# Patient Record
Sex: Female | Born: 1970 | Race: White | Hispanic: No | Marital: Married | Smoking: Never smoker
Health system: Southern US, Community
[De-identification: ages and names within clinical notes are randomized; demographics above are authoritative.]

## PROBLEM LIST (undated history)

## (undated) DIAGNOSIS — Z8489 Family history of other specified conditions: Secondary | ICD-10-CM

## (undated) DIAGNOSIS — K5900 Constipation, unspecified: Secondary | ICD-10-CM

## (undated) DIAGNOSIS — C73 Malignant neoplasm of thyroid gland: Secondary | ICD-10-CM

## (undated) DIAGNOSIS — R112 Nausea with vomiting, unspecified: Secondary | ICD-10-CM

## (undated) DIAGNOSIS — E041 Nontoxic single thyroid nodule: Secondary | ICD-10-CM

## (undated) DIAGNOSIS — Z9889 Other specified postprocedural states: Secondary | ICD-10-CM

## (undated) DIAGNOSIS — G43909 Migraine, unspecified, not intractable, without status migrainosus: Secondary | ICD-10-CM

## (undated) HISTORY — PX: TONSILLECTOMY: SUR1361

## (undated) HISTORY — PX: ADENOIDECTOMY: SUR15

## (undated) HISTORY — DX: Constipation, unspecified: K59.00

## (undated) HISTORY — PX: VARICOSE VEIN SURGERY: SHX832

## (undated) HISTORY — PX: TYMPANOSTOMY TUBE PLACEMENT: SHX32

---

## 1998-09-22 ENCOUNTER — Inpatient Hospital Stay (HOSPITAL_COMMUNITY): Admission: AD | Admit: 1998-09-22 | Discharge: 1998-09-22 | Payer: Self-pay | Admitting: Obstetrics and Gynecology

## 1998-09-24 ENCOUNTER — Encounter: Payer: Self-pay | Admitting: Obstetrics and Gynecology

## 1998-09-24 ENCOUNTER — Ambulatory Visit (HOSPITAL_COMMUNITY): Admission: RE | Admit: 1998-09-24 | Discharge: 1998-09-24 | Payer: Self-pay | Admitting: Obstetrics and Gynecology

## 1998-10-17 ENCOUNTER — Other Ambulatory Visit: Admission: RE | Admit: 1998-10-17 | Discharge: 1998-10-17 | Payer: Self-pay | Admitting: Obstetrics and Gynecology

## 1999-05-09 ENCOUNTER — Inpatient Hospital Stay (HOSPITAL_COMMUNITY): Admission: AD | Admit: 1999-05-09 | Discharge: 1999-05-11 | Payer: Self-pay | Admitting: Obstetrics and Gynecology

## 1999-06-11 ENCOUNTER — Other Ambulatory Visit: Admission: RE | Admit: 1999-06-11 | Discharge: 1999-06-11 | Payer: Self-pay | Admitting: Obstetrics and Gynecology

## 2000-07-08 ENCOUNTER — Other Ambulatory Visit: Admission: RE | Admit: 2000-07-08 | Discharge: 2000-07-08 | Payer: Self-pay | Admitting: Obstetrics and Gynecology

## 2000-08-19 ENCOUNTER — Encounter: Payer: Self-pay | Admitting: Vascular Surgery

## 2000-08-21 ENCOUNTER — Ambulatory Visit (HOSPITAL_COMMUNITY): Admission: RE | Admit: 2000-08-21 | Discharge: 2000-08-21 | Payer: Self-pay | Admitting: Vascular Surgery

## 2001-07-23 ENCOUNTER — Other Ambulatory Visit: Admission: RE | Admit: 2001-07-23 | Discharge: 2001-07-23 | Payer: Self-pay | Admitting: Obstetrics and Gynecology

## 2002-07-25 ENCOUNTER — Other Ambulatory Visit: Admission: RE | Admit: 2002-07-25 | Discharge: 2002-07-25 | Payer: Self-pay | Admitting: Obstetrics and Gynecology

## 2003-03-24 ENCOUNTER — Ambulatory Visit (HOSPITAL_COMMUNITY): Admission: RE | Admit: 2003-03-24 | Discharge: 2003-03-24 | Payer: Self-pay | Admitting: Vascular Surgery

## 2003-09-06 ENCOUNTER — Other Ambulatory Visit: Admission: RE | Admit: 2003-09-06 | Discharge: 2003-09-06 | Payer: Self-pay | Admitting: Obstetrics and Gynecology

## 2004-10-07 ENCOUNTER — Other Ambulatory Visit: Admission: RE | Admit: 2004-10-07 | Discharge: 2004-10-07 | Payer: Self-pay | Admitting: Obstetrics and Gynecology

## 2004-10-14 ENCOUNTER — Encounter: Admission: RE | Admit: 2004-10-14 | Discharge: 2004-10-14 | Payer: Self-pay | Admitting: Obstetrics and Gynecology

## 2005-04-24 ENCOUNTER — Encounter: Admission: RE | Admit: 2005-04-24 | Discharge: 2005-04-24 | Payer: Self-pay | Admitting: Obstetrics and Gynecology

## 2013-06-11 ENCOUNTER — Other Ambulatory Visit: Payer: Self-pay | Admitting: Diagnostic Neuroimaging

## 2013-07-15 ENCOUNTER — Other Ambulatory Visit: Payer: Self-pay | Admitting: Diagnostic Neuroimaging

## 2013-09-09 ENCOUNTER — Other Ambulatory Visit: Payer: Self-pay | Admitting: Diagnostic Neuroimaging

## 2014-09-21 ENCOUNTER — Other Ambulatory Visit: Payer: Self-pay | Admitting: Obstetrics and Gynecology

## 2014-09-22 LAB — CYTOLOGY - PAP

## 2016-01-24 ENCOUNTER — Other Ambulatory Visit (HOSPITAL_COMMUNITY): Payer: Self-pay | Admitting: Surgery

## 2016-01-24 DIAGNOSIS — R221 Localized swelling, mass and lump, neck: Secondary | ICD-10-CM

## 2016-02-01 ENCOUNTER — Ambulatory Visit (HOSPITAL_COMMUNITY)
Admission: RE | Admit: 2016-02-01 | Discharge: 2016-02-01 | Disposition: A | Discharge status: Home / Self Care | Payer: 59 | Source: Ambulatory Visit | Attending: Surgery | Admitting: Surgery

## 2016-02-01 DIAGNOSIS — R221 Localized swelling, mass and lump, neck: Secondary | ICD-10-CM

## 2016-02-14 ENCOUNTER — Other Ambulatory Visit: Payer: Self-pay | Admitting: Physician Assistant

## 2016-02-14 ENCOUNTER — Other Ambulatory Visit: Payer: Self-pay | Admitting: General Surgery

## 2016-02-15 ENCOUNTER — Ambulatory Visit (HOSPITAL_COMMUNITY): Admission: RE | Admit: 2016-02-15 | Payer: 59 | Source: Ambulatory Visit

## 2016-02-15 ENCOUNTER — Encounter (HOSPITAL_COMMUNITY): Payer: Self-pay

## 2016-02-15 ENCOUNTER — Ambulatory Visit (HOSPITAL_COMMUNITY)
Admission: RE | Admit: 2016-02-15 | Discharge: 2016-02-15 | Disposition: A | Discharge status: Home / Self Care | Payer: 59 | Source: Ambulatory Visit | Attending: Surgery | Admitting: Surgery

## 2016-02-15 DIAGNOSIS — R221 Localized swelling, mass and lump, neck: Secondary | ICD-10-CM | POA: Diagnosis not present

## 2016-02-15 DIAGNOSIS — G43909 Migraine, unspecified, not intractable, without status migrainosus: Secondary | ICD-10-CM | POA: Insufficient documentation

## 2016-02-15 HISTORY — DX: Migraine, unspecified, not intractable, without status migrainosus: G43.909

## 2016-02-15 LAB — CBC
HEMATOCRIT: 43.8 % (ref 36.0–46.0)
HEMOGLOBIN: 14.5 g/dL (ref 12.0–15.0)
MCH: 30.1 pg (ref 26.0–34.0)
MCHC: 33.1 g/dL (ref 30.0–36.0)
MCV: 91.1 fL (ref 78.0–100.0)
Platelets: 228 10*3/uL (ref 150–400)
RBC: 4.81 MIL/uL (ref 3.87–5.11)
RDW: 13.4 % (ref 11.5–15.5)
WBC: 7.1 10*3/uL (ref 4.0–10.5)

## 2016-02-15 LAB — PROTIME-INR
INR: 1.02
PROTHROMBIN TIME: 13.5 s (ref 11.4–15.2)

## 2016-02-15 LAB — APTT: aPTT: 28 seconds (ref 24–36)

## 2016-02-15 MED ORDER — SODIUM CHLORIDE 0.9 % IV SOLN: INTRAVENOUS | Status: DC

## 2016-02-15 MED ORDER — LIDOCAINE HCL (PF) 1 % IJ SOLN
INTRAMUSCULAR | Status: AC
  Filled 2016-02-15: qty 10

## 2016-02-15 NOTE — H&P (Signed)
Chief Complaint: left neck mass  Referring Physician:Dr. Armandina Gemma  Supervising Physician: Arne Cleveland  Patient Status: [facility] - Out-pt  HPI: Annette Roberson is an 45 y.o. female with no other medical history who was found to have a left neck mass by her dentist.  She was sent to her PCP who ordered a CT of her neck.  This revealed a mass and she was sent to see Dr. Harlow Asa.  A neck US was then completed which revealed a large left neck mass that was partially cystic and partially solid.  A biopsy was then requested to determine what the pathology of this mass.  She is not having any symptoms from this mass.  She otherwise has no complaints.  Past Medical History:  Past Medical History:  Diagnosis Date  . Migraines     Past Surgical History:  Past Surgical History:  Procedure Laterality Date  . VARICOSE VEIN SURGERY      Family History: History reviewed. No pertinent family history.  Social History:  reports that she has never smoked. She has never used smokeless tobacco. She reports that she does not drink alcohol or use drugs.  Allergies: No Known Allergies  Medications: Medications reviewed in Epic  Please HPI for pertinent positives, otherwise complete 10 system ROS negative.  Mallampati Score: MD Evaluation Airway: WNL Heart: WNL Abdomen: WNL ASA  Classification: 1 Mallampati/Airway Score: Two  Physical Exam: BP 106/76   Pulse 86   Temp 98.3 F (36.8 C) (Oral)   Resp 16   Ht 5\' 6"  (1.676 m)   Wt 140 lb (63.5 kg)   LMP  (LMP Unknown) Comment: birth control pills(continues cycle)  SpO2 100%   BMI 22.60 kg/m  Body mass index is 22.6 kg/m. General: pleasant, WD, WN white female who is laying in bed in NAD HEENT: head is normocephalic, atraumatic.  Sclera are noninjected.  PERRL.  Ears and nose without any masses or lesions.  Mouth is pink and moist. Left neck bulge is present Heart: regular, rate, and rhythm.  Normal s1,s2. No obvious murmurs,  gallops, or rubs noted.  Palpable radial and pedal pulses bilaterally Lungs: CTAB, no wheezes, rhonchi, or rales noted.  Respiratory effort nonlabored Abd: soft, NT, ND, +BS, no masses, hernias, or organomegaly Psych: A&Ox3 with an appropriate affect.   Labs: Results for orders placed or performed during the hospital encounter of 02/15/16 (from the past 48 hour(s))  APTT upon arrival     Status: None   Collection Time: 02/15/16  6:08 AM  Result Value Ref Range   aPTT 28 24 - 36 seconds  CBC upon arrival     Status: None   Collection Time: 02/15/16  6:08 AM  Result Value Ref Range   WBC 7.1 4.0 - 10.5 K/uL   RBC 4.81 3.87 - 5.11 MIL/uL   Hemoglobin 14.5 12.0 - 15.0 g/dL   HCT 43.8 36.0 - 46.0 %   MCV 91.1 78.0 - 100.0 fL   MCH 30.1 26.0 - 34.0 pg   MCHC 33.1 30.0 - 36.0 g/dL   RDW 13.4 11.5 - 15.5 %   Platelets 228 150 - 400 K/uL  Protime-INR upon arrival     Status: None   Collection Time: 02/15/16  6:08 AM  Result Value Ref Range   Prothrombin Time 13.5 11.4 - 15.2 seconds   INR 1.02     Imaging: No results found.  Assessment/Plan 1. Left neck mass -we will plan to proceed  with a biopsy today. -patient requests no sedation -labs and vitals have been reviewed -Risks and Benefits discussed with the patient including, but not limited to bleeding, infection, damage to adjacent structures or low yield requiring additional tests. All of the patient's questions were answered, patient is agreeable to proceed. Consent signed and in chart.   Thank you for this interesting consult.  I greatly enjoyed meeting Annette Roberson and look forward to participating in their care.  A copy of this report was sent to the requesting provider on this date.  Electronically Signed: Henreitta Cea 02/15/2016, 7:51 AM   I spent a total of  30 Minutes   in face to face in clinical consultation, greater than 50% of which was counseling/coordinating care for left neck mass

## 2016-02-15 NOTE — Procedures (Signed)
[place] core biopsy L neck mass 18g cores and aspirate to surg path No complication No blood loss. See complete dictation in [facility].

## 2016-02-15 NOTE — Progress Notes (Signed)
Biopsy completed. Tolerated well. Discharge instruction given. D/C home walking with husband. Awake and alert. In no distress.

## 2016-02-28 ENCOUNTER — Other Ambulatory Visit (HOSPITAL_COMMUNITY): Payer: Self-pay | Admitting: Surgery

## 2016-02-28 ENCOUNTER — Ambulatory Visit
Admission: RE | Admit: 2016-02-28 | Discharge: 2016-02-28 | Disposition: A | Discharge status: Home / Self Care | Payer: 59 | Source: Ambulatory Visit | Attending: Surgery | Admitting: Surgery

## 2016-02-28 DIAGNOSIS — C801 Malignant (primary) neoplasm, unspecified: Secondary | ICD-10-CM

## 2016-03-03 ENCOUNTER — Other Ambulatory Visit (HOSPITAL_COMMUNITY): Payer: Self-pay | Admitting: Surgery

## 2016-03-03 DIAGNOSIS — R221 Localized swelling, mass and lump, neck: Secondary | ICD-10-CM

## 2016-03-05 ENCOUNTER — Other Ambulatory Visit: Payer: Self-pay | Admitting: Radiology

## 2016-03-06 ENCOUNTER — Other Ambulatory Visit (HOSPITAL_COMMUNITY): Payer: Self-pay | Admitting: Surgery

## 2016-03-06 ENCOUNTER — Ambulatory Visit (HOSPITAL_COMMUNITY)
Admission: RE | Admit: 2016-03-06 | Discharge: 2016-03-06 | Disposition: A | Discharge status: Home / Self Care | Payer: 59 | Source: Ambulatory Visit | Attending: Surgery | Admitting: Surgery

## 2016-03-06 DIAGNOSIS — C73 Malignant neoplasm of thyroid gland: Secondary | ICD-10-CM | POA: Diagnosis not present

## 2016-03-06 DIAGNOSIS — R221 Localized swelling, mass and lump, neck: Secondary | ICD-10-CM

## 2016-03-06 MED ORDER — LIDOCAINE-EPINEPHRINE 1 %-1:100000 IJ SOLN
INTRAMUSCULAR | Status: AC
  Filled 2016-03-06: qty 1

## 2016-03-06 NOTE — Procedures (Signed)
Technically successful US guided fine needle aspiration of indeterminate nodule within the inferior medial aspect of the left lobe of the thyroid.  Technically successful US guided aspiration of approximately 10 cc of dark black fluid from the cystic component of the complex neck mass within the inferior aspect of the left side of the neck, followed by FNA and core needle biopsy of solid component of the complex neck mass.  EBL: None    No immediate complications.   Ronny Bacon, MD Pager #: [phone]

## 2016-03-27 ENCOUNTER — Ambulatory Visit: Payer: Self-pay | Admitting: Otolaryngology

## 2016-03-27 NOTE — H&P (Signed)
Annette Roberson is a 45 y.o. female who presents as a consult Patient.   Referring Provider: Dossie Arbour*  Chief complaint: Thyroid cancer.  HPI: She was found on routine dental exam a few months ago to have a small lump in her left lower neck. 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 She had no symptoms related to this. She is otherwise in good health.  PMH/Meds/All/SocHx/FamHx/ROS:   Past Medical History:  Diagnosis Date  . Hearing loss  . Migraines   Past Surgical History:  Procedure Laterality Date  . VARICOSE VEIN SURGERY   No family history of bleeding disorders, wound healing problems or difficulty with anesthesia.   Social History   Social History  . Marital status: Married  Spouse name: N/A  . Number of children: N/A  . Years of education: N/A   Occupational History  . Not on file.   Social History Main Topics  . Smoking status: Never Smoker  . Smokeless tobacco: Never Used  . Alcohol use No  . Drug use: Unknown  . Sexual activity: Not on file   Other Topics Concern  . Not on file   Social History Narrative  . No narrative on file   Current Outpatient Prescriptions:  . amitriptyline (ELAVIL) 50 MG tablet, , Disp: , Rfl:  . cholecalciferol, vitamin D3, 1,000 unit capsule, Take 1,000 Units by mouth., Disp: , Rfl:  . eletriptan (RELPAX) 40 MG tablet, , Disp: , Rfl:  . multivitamin,tx-minerals (VITAMINS AND MINERALS) Tab, Take 1 tablet by mouth., Disp: , Rfl:  . norethindrone-ethinyl estradiol (MICROGESTIN 1/20) 1-20 mg-mcg per tablet, , Disp: , Rfl:  . POLYETHYLENE GLYCOL 3350 (MIRALAX ORAL), Take by mouth., Disp: , Rfl:  . PSYLLIUM HUSK (METAMUCIL ORAL), Take by mouth., Disp: , Rfl:  . topiramate (TOPAMAX) 100 MG tablet, , Disp: , Rfl:   A complete ROS was performed with pertinent positives/negatives noted in the HPI. The remainder of the ROS are  negative.   Physical Exam:   BP 94/59  Ht 1.676 m (5\' 6" )  Wt 63 kg (138 lb 12.8 oz)  BMI 22.40 kg/m   General: Healthy and alert, in no distress, breathing easily. Normal affect. In a pleasant mood. Head: Normocephalic, atraumatic. No masses, or scars. Eyes: Pupils are equal, and reactive to light. Vision is grossly intact. No spontaneous or gaze nystagmus. Ears: Bilateral cerumen impaction cleaned out under the microscope. Tympanic membranes are intact, with normal landmarks and the middle ears are clear and healthy. Hearing: Grossly normal. Nose: Nasal cavities are clear with healthy mucosa, no polyps or exudate.Airways are patent. Face: No masses or scars, facial nerve function is symmetric. Oral Cavity: No mucosal abnormalities are noted. Tongue with normal mobility. Dentition appears healthy. Oropharynx: Tonsils are symmetric. There are no mucosal masses identified. Tongue base appears normal and healthy. Larynx/Hypopharynx: indirect exam reveals healthy, mobile vocal cords, without mucosal lesions in the hypopharynx or larynx. Chest: Deferred Neck: Small left thyroid nodule palpable. Left supraclavicular lymph node palpable, approximately 3 cm. No other masses palpable. Neuro: Cranial nerves II-XII will normal function. Balance: Normal gate. Other findings: none.  Independent Review of Additional Tests or Records:  none  Procedures:  Procedure note:  Indications: Cerumen impaction  Details of cerumen removal were discussed with the patient and all questions were answered.  Procedure:  Using the operating microscope, both sides were cleaned of cerumen using curettes. There was  no signs of infection. Symptoms were releaved.  She tolerated this procedure well. There were no complications.  Impression & Plans:  Metastatic thyroid carcinoma. Recommend total thyroidectomy and selective left neck dissection. Risks and benefits were discussed in detail. Risk of infection,  bleeding, chyle leak, recurrent nerve injury, airway problems, tracheostomy, hypocalcemia, wound infection, were all discussed. All questions were answered. We will schedule at her convenience.

## 2016-04-03 NOTE — Pre-Procedure Instructions (Signed)
Annette Roberson  04/03/2016      [facility] - [place], [place] - [address] [place] [address] Phone: [phone] Fax: [phone]  [facility] [place], [place] - [address] [place] [address] Phone: [phone] Fax: [phone]    Your procedure is scheduled on April 10, 2016.  Report to [facility] Admitting at 0930 A.M.  Call this number if you have problems the morning of surgery:  [phone]   Remember:  Do not eat food or drink liquids after midnight.  Take these medicines the morning of surgery with A SIP OF WATER Microgestin/Junel/Loestrin, Relpax, Topamax  7 days prior to surgery STOP taking any Aspirin, Aleve, Naproxen, Ibuprofen, Motrin, Advil, Goody's, BC's, all herbal medications, fish oil,  and all vitamins    Do not wear jewelry, make-up or nail polish.  Do not wear lotions, powders, or perfumes, or deodorant.  Do not shave 48 hours prior to surgery.   Do not bring valuables to the hospital.  [facility] is not responsible for any belongings or valuables.  Contacts, dentures or bridgework may not be worn into surgery.  Leave your suitcase in the car.  After surgery it may be brought to your room.  For patients admitted to the hospital, discharge time will be determined by your treatment team.  Patients discharged the day of surgery will not be allowed to drive home.   Name and phone number of your driver:    Special instructions:   [facility]- Preparing For Surgery  Before surgery, you can play an important role. Because skin is not sterile, your skin needs to be as free of germs as possible. You can reduce the number of germs on your skin by washing with CHG (chlorahexidine gluconate) Soap before surgery.  CHG is an antiseptic cleaner which kills germs and bonds with the skin to continue killing germs even after  washing.  Please do not use if you have an allergy to CHG or antibacterial soaps. If your skin becomes reddened/irritated stop using the CHG.  Do not shave (including legs and underarms) for at least 48 hours prior to first CHG shower. It is OK to shave your face.  Please follow these instructions carefully.   1. Shower the NIGHT BEFORE SURGERY and the MORNING OF SURGERY with CHG.   2. If you chose to wash your hair, wash your hair first as usual with your normal shampoo.  3. After you shampoo, rinse your hair and body thoroughly to remove the shampoo.  4. Use CHG as you would any other liquid soap. You can apply CHG directly to the skin and wash gently with a scrungie or a clean washcloth.   5. Apply the CHG Soap to your body ONLY FROM THE NECK DOWN.  Do not use on open wounds or open sores. Avoid contact with your eyes, ears, mouth and genitals (private parts). Wash genitals (private parts) with your normal soap.  6. Wash thoroughly, paying special attention to the area where your surgery will be performed.  7. Thoroughly rinse your body with warm water from the neck down.  8. DO NOT shower/wash with your normal soap after using and rinsing off the CHG Soap.  9. Pat yourself dry with a CLEAN TOWEL.   10. Wear CLEAN PAJAMAS   11. Place CLEAN SHEETS on your  bed the night of your first shower and DO NOT SLEEP WITH PETS.    Day of Surgery: Do not apply any deodorants/lotions. Please wear clean clothes to the hospital/surgery center.      Please read over the following fact sheets that you were given. Pain Booklet

## 2016-04-04 ENCOUNTER — Ambulatory Visit (HOSPITAL_COMMUNITY)
Admission: RE | Admit: 2016-04-04 | Discharge: 2016-04-04 | Disposition: A | Discharge status: Home / Self Care | Payer: 59 | Source: Ambulatory Visit | Attending: Anesthesiology | Admitting: Anesthesiology

## 2016-04-04 ENCOUNTER — Encounter (HOSPITAL_COMMUNITY): Payer: Self-pay

## 2016-04-04 ENCOUNTER — Encounter (HOSPITAL_COMMUNITY)
Admission: RE | Admit: 2016-04-04 | Discharge: 2016-04-04 | Disposition: A | Discharge status: Home / Self Care | Payer: 59 | Source: Ambulatory Visit | Attending: Otolaryngology | Admitting: Otolaryngology

## 2016-04-04 DIAGNOSIS — Z01812 Encounter for preprocedural laboratory examination: Secondary | ICD-10-CM | POA: Insufficient documentation

## 2016-04-04 DIAGNOSIS — G43909 Migraine, unspecified, not intractable, without status migrainosus: Secondary | ICD-10-CM | POA: Insufficient documentation

## 2016-04-04 DIAGNOSIS — R221 Localized swelling, mass and lump, neck: Secondary | ICD-10-CM | POA: Diagnosis not present

## 2016-04-04 DIAGNOSIS — Z01818 Encounter for other preprocedural examination: Secondary | ICD-10-CM | POA: Insufficient documentation

## 2016-04-04 HISTORY — DX: Other specified postprocedural states: Z98.890

## 2016-04-04 HISTORY — DX: Nausea with vomiting, unspecified: R11.2

## 2016-04-04 LAB — SURGICAL PCR SCREEN
MRSA, PCR: NEGATIVE
Staphylococcus aureus: NEGATIVE

## 2016-04-04 LAB — CBC
HCT: 42 % (ref 36.0–46.0)
Hemoglobin: 14.1 g/dL (ref 12.0–15.0)
MCH: 30.9 pg (ref 26.0–34.0)
MCHC: 33.6 g/dL (ref 30.0–36.0)
MCV: 92.1 fL (ref 78.0–100.0)
PLATELETS: 226 10*3/uL (ref 150–400)
RBC: 4.56 MIL/uL (ref 3.87–5.11)
RDW: 13.4 % (ref 11.5–15.5)
WBC: 8.7 10*3/uL (ref 4.0–10.5)

## 2016-04-04 LAB — BASIC METABOLIC PANEL
Anion gap: 7 (ref 5–15)
BUN: 16 mg/dL (ref 6–20)
CALCIUM: 9.2 mg/dL (ref 8.9–10.3)
CO2: 22 mmol/L (ref 22–32)
CREATININE: 0.82 mg/dL (ref 0.44–1.00)
Chloride: 110 mmol/L (ref 101–111)
GFR calc non Af Amer: >60 mL/min (ref >60)
Glucose, Bld: 92 mg/dL (ref 65–99)
Potassium: 3.9 mmol/L (ref 3.5–5.1)
SODIUM: 139 mmol/L (ref 135–145)

## 2016-04-04 LAB — HCG, SERUM, QUALITATIVE: PREG SERUM: NEGATIVE

## 2016-04-04 NOTE — Progress Notes (Signed)
PCP Dr. Barbee Shropshire Patient denies any chest pain, shortness of breath will in PAT. Has never had a cardiologist, echo, stress test, or cardiac cath.

## 2016-04-10 ENCOUNTER — Inpatient Hospital Stay (HOSPITAL_COMMUNITY)
Admission: RE | Admit: 2016-04-10 | Discharge: 2016-04-13 | DRG: 627 | Disposition: A | Discharge status: Home / Self Care | Payer: 59 | Source: Ambulatory Visit | Attending: Otolaryngology | Admitting: Otolaryngology

## 2016-04-10 ENCOUNTER — Encounter (HOSPITAL_COMMUNITY): Payer: Self-pay | Admitting: Certified Registered Nurse Anesthetist

## 2016-04-10 ENCOUNTER — Encounter (HOSPITAL_COMMUNITY)
Admission: RE | Disposition: A | Discharge status: Home / Self Care | Payer: Self-pay | Source: Ambulatory Visit | Attending: Otolaryngology

## 2016-04-10 ENCOUNTER — Inpatient Hospital Stay (HOSPITAL_COMMUNITY): Payer: 59 | Admitting: Certified Registered Nurse Anesthetist

## 2016-04-10 DIAGNOSIS — R202 Paresthesia of skin: Secondary | ICD-10-CM | POA: Diagnosis not present

## 2016-04-10 DIAGNOSIS — R252 Cramp and spasm: Secondary | ICD-10-CM | POA: Diagnosis not present

## 2016-04-10 DIAGNOSIS — C73 Malignant neoplasm of thyroid gland: Principal | ICD-10-CM | POA: Diagnosis present

## 2016-04-10 HISTORY — PX: RADICAL NECK DISSECTION: SHX2284

## 2016-04-10 HISTORY — DX: Family history of other specified conditions: Z84.89

## 2016-04-10 HISTORY — PX: THYROIDECTOMY: SHX17

## 2016-04-10 HISTORY — DX: Malignant neoplasm of thyroid gland: C73

## 2016-04-10 HISTORY — DX: Nontoxic single thyroid nodule: E04.1

## 2016-04-10 LAB — CALCIUM: Calcium: 8.2 mg/dL — ABNORMAL LOW (ref 8.9–10.3)

## 2016-04-10 SURGERY — THYROIDECTOMY
Anesthesia: General | Site: Neck

## 2016-04-10 MED ORDER — NAPROXEN SODIUM 550 MG PO TABS
550.0000 mg | ORAL_TABLET | Freq: Every day | ORAL | Status: DC | PRN
  Filled 2016-04-10: qty 1

## 2016-04-10 MED ORDER — BACITRACIN ZINC 500 UNIT/GM EX OINT
TOPICAL_OINTMENT | CUTANEOUS | Status: AC
  Filled 2016-04-10: qty 28.35

## 2016-04-10 MED ORDER — PROPOFOL 500 MG/50ML IV EMUL
INTRAVENOUS | Status: DC | PRN
  Administered 2016-04-10: 25 ug/kg/min via INTRAVENOUS

## 2016-04-10 MED ORDER — MEPERIDINE HCL 25 MG/ML IJ SOLN: 6.2500 mg | INTRAMUSCULAR | Status: DC | PRN

## 2016-04-10 MED ORDER — 0.9 % SODIUM CHLORIDE (POUR BTL) OPTIME
TOPICAL | Status: DC | PRN
  Administered 2016-04-10: 1000 mL

## 2016-04-10 MED ORDER — HYDROCODONE-ACETAMINOPHEN 7.5-325 MG PO TABS: 1.0000 | ORAL_TABLET | Freq: Four times a day (QID) | ORAL | 0 refills | Status: DC | PRN

## 2016-04-10 MED ORDER — NORETHINDRONE ACET-ETHINYL EST 1-20 MG-MCG PO TABS
1.0000 | ORAL_TABLET | Freq: Every day | ORAL | Status: DC
  Administered 2016-04-11: 1 via ORAL

## 2016-04-10 MED ORDER — VITAMIN D3 25 MCG (1000 UNIT) PO TABS
1000.0000 [IU] | ORAL_TABLET | Freq: Every day | ORAL | Status: DC
  Administered 2016-04-10 – 2016-04-12 (×3): 1000 [IU] via ORAL
  Filled 2016-04-10 (×6): qty 1

## 2016-04-10 MED ORDER — SCOPOLAMINE 1 MG/3DAYS TD PT72
MEDICATED_PATCH | TRANSDERMAL | Status: AC
  Filled 2016-04-10: qty 1

## 2016-04-10 MED ORDER — AMITRIPTYLINE HCL 50 MG PO TABS
50.0000 mg | ORAL_TABLET | Freq: Every day | ORAL | Status: DC
  Administered 2016-04-10 – 2016-04-12 (×3): 50 mg via ORAL
  Filled 2016-04-10 (×3): qty 1

## 2016-04-10 MED ORDER — HYDROCODONE-ACETAMINOPHEN 5-325 MG PO TABS
1.0000 | ORAL_TABLET | ORAL | Status: DC | PRN
  Administered 2016-04-13: 2 via ORAL
  Filled 2016-04-10: qty 2

## 2016-04-10 MED ORDER — DEXAMETHASONE SODIUM PHOSPHATE 10 MG/ML IJ SOLN
INTRAMUSCULAR | Status: AC
  Filled 2016-04-10: qty 1

## 2016-04-10 MED ORDER — PHENYLEPHRINE HCL 10 MG/ML IJ SOLN
INTRAVENOUS | Status: DC | PRN
  Administered 2016-04-10: 25 ug/min via INTRAVENOUS

## 2016-04-10 MED ORDER — MIDAZOLAM HCL 2 MG/2ML IJ SOLN
INTRAMUSCULAR | Status: DC | PRN
  Administered 2016-04-10 (×2): 1 mg via INTRAVENOUS

## 2016-04-10 MED ORDER — LACTATED RINGERS IV SOLN
INTRAVENOUS | Status: DC
  Administered 2016-04-10 (×3): via INTRAVENOUS

## 2016-04-10 MED ORDER — ADULT MULTIVITAMIN W/MINERALS CH
1.0000 | ORAL_TABLET | Freq: Every day | ORAL | Status: DC
  Administered 2016-04-10 – 2016-04-12 (×3): 1 via ORAL
  Filled 2016-04-10 (×3): qty 1

## 2016-04-10 MED ORDER — ONDANSETRON HCL 4 MG/2ML IJ SOLN
INTRAMUSCULAR | Status: AC
  Filled 2016-04-10: qty 2

## 2016-04-10 MED ORDER — FENTANYL CITRATE (PF) 100 MCG/2ML IJ SOLN
INTRAMUSCULAR | Status: DC | PRN
  Administered 2016-04-10 (×2): 50 ug via INTRAVENOUS
  Administered 2016-04-10: 150 ug via INTRAVENOUS
  Administered 2016-04-10: 50 ug via INTRAVENOUS

## 2016-04-10 MED ORDER — ROCURONIUM BROMIDE 100 MG/10ML IV SOLN
INTRAVENOUS | Status: DC | PRN
  Administered 2016-04-10: 25 mg via INTRAVENOUS
  Administered 2016-04-10: 50 mg via INTRAVENOUS

## 2016-04-10 MED ORDER — ARTIFICIAL TEARS OP OINT
TOPICAL_OINTMENT | OPHTHALMIC | Status: AC
  Filled 2016-04-10: qty 3.5

## 2016-04-10 MED ORDER — PROPOFOL 10 MG/ML IV BOLUS
INTRAVENOUS | Status: DC | PRN
  Administered 2016-04-10: 200 mg via INTRAVENOUS

## 2016-04-10 MED ORDER — LIDOCAINE HCL (CARDIAC) 20 MG/ML IV SOLN
INTRAVENOUS | Status: DC | PRN
  Administered 2016-04-10: 30 mg via INTRATRACHEAL

## 2016-04-10 MED ORDER — PROMETHAZINE HCL 25 MG/ML IJ SOLN
INTRAMUSCULAR | Status: AC
  Filled 2016-04-10: qty 1

## 2016-04-10 MED ORDER — PROPOFOL 1000 MG/100ML IV EMUL
INTRAVENOUS | Status: AC
  Filled 2016-04-10: qty 100

## 2016-04-10 MED ORDER — SUGAMMADEX SODIUM 200 MG/2ML IV SOLN
INTRAVENOUS | Status: DC | PRN
  Administered 2016-04-10: 200 mg via INTRAVENOUS

## 2016-04-10 MED ORDER — LEVOTHYROXINE SODIUM 100 MCG PO TABS
100.0000 ug | ORAL_TABLET | Freq: Every day | ORAL | Status: DC
  Administered 2016-04-11 – 2016-04-13 (×3): 100 ug via ORAL
  Filled 2016-04-10 (×3): qty 1

## 2016-04-10 MED ORDER — PROMETHAZINE HCL 25 MG RE SUPP: 25.0000 mg | Freq: Four times a day (QID) | RECTAL | 1 refills | Status: DC | PRN

## 2016-04-10 MED ORDER — PROMETHAZINE HCL 25 MG/ML IJ SOLN
6.2500 mg | INTRAMUSCULAR | Status: DC | PRN
  Administered 2016-04-10: 12.5 mg via INTRAVENOUS

## 2016-04-10 MED ORDER — ONDANSETRON HCL 4 MG/2ML IJ SOLN
INTRAMUSCULAR | Status: DC | PRN
  Administered 2016-04-10 (×2): 4 mg via INTRAVENOUS

## 2016-04-10 MED ORDER — HYDROMORPHONE HCL 1 MG/ML IJ SOLN: 0.2500 mg | INTRAMUSCULAR | Status: DC | PRN

## 2016-04-10 MED ORDER — PROMETHAZINE HCL 25 MG RE SUPP: 25.0000 mg | Freq: Four times a day (QID) | RECTAL | Status: DC | PRN

## 2016-04-10 MED ORDER — LIDOCAINE 2% (20 MG/ML) 5 ML SYRINGE
INTRAMUSCULAR | Status: AC
  Filled 2016-04-10: qty 5

## 2016-04-10 MED ORDER — ELETRIPTAN HYDROBROMIDE 20 MG PO TABS
20.0000 mg | ORAL_TABLET | ORAL | Status: DC | PRN
  Administered 2016-04-10 (×2): 20 mg via ORAL
  Filled 2016-04-10 (×5): qty 1

## 2016-04-10 MED ORDER — POLYETHYLENE GLYCOL 3350 17 G PO PACK: 17.0000 g | PACK | Freq: Every day | ORAL | Status: DC | PRN

## 2016-04-10 MED ORDER — METOPROLOL TARTRATE 5 MG/5ML IV SOLN
INTRAVENOUS | Status: AC
  Filled 2016-04-10: qty 5

## 2016-04-10 MED ORDER — PROPOFOL 10 MG/ML IV BOLUS
INTRAVENOUS | Status: AC
  Filled 2016-04-10: qty 20

## 2016-04-10 MED ORDER — LEVOTHYROXINE SODIUM 100 MCG PO TABS: 100.0000 ug | ORAL_TABLET | Freq: Every day | ORAL | 6 refills | Status: AC

## 2016-04-10 MED ORDER — MIDAZOLAM HCL 2 MG/2ML IJ SOLN: 0.5000 mg | Freq: Once | INTRAMUSCULAR | Status: DC | PRN

## 2016-04-10 MED ORDER — GLYCOPYRROLATE 0.2 MG/ML IJ SOLN
INTRAMUSCULAR | Status: DC | PRN
  Administered 2016-04-10: 0.2 mg via INTRAVENOUS

## 2016-04-10 MED ORDER — PROMETHAZINE HCL 25 MG PO TABS: 25.0000 mg | ORAL_TABLET | Freq: Four times a day (QID) | ORAL | Status: DC | PRN

## 2016-04-10 MED ORDER — DEXAMETHASONE SODIUM PHOSPHATE 10 MG/ML IJ SOLN
INTRAMUSCULAR | Status: DC | PRN
  Administered 2016-04-10: 10 mg via INTRAVENOUS

## 2016-04-10 MED ORDER — ROCURONIUM BROMIDE 50 MG/5ML IV SOSY
PREFILLED_SYRINGE | INTRAVENOUS | Status: AC
  Filled 2016-04-10: qty 5

## 2016-04-10 MED ORDER — TOPIRAMATE 100 MG PO TABS
100.0000 mg | ORAL_TABLET | Freq: Every day | ORAL | Status: DC
  Administered 2016-04-10 – 2016-04-12 (×3): 100 mg via ORAL
  Filled 2016-04-10 (×4): qty 1

## 2016-04-10 MED ORDER — LIDOCAINE-EPINEPHRINE (PF) 1 %-1:200000 IJ SOLN
INTRAMUSCULAR | Status: AC
  Filled 2016-04-10: qty 30

## 2016-04-10 MED ORDER — FENTANYL CITRATE (PF) 100 MCG/2ML IJ SOLN
INTRAMUSCULAR | Status: AC
  Filled 2016-04-10: qty 6

## 2016-04-10 MED ORDER — PSYLLIUM 95 % PO PACK
1.0000 | PACK | Freq: Every day | ORAL | Status: DC
  Administered 2016-04-11 – 2016-04-12 (×2): 1 via ORAL
  Filled 2016-04-10 (×4): qty 1

## 2016-04-10 MED ORDER — ARTIFICIAL TEARS OP OINT
TOPICAL_OINTMENT | OPHTHALMIC | Status: DC | PRN
  Administered 2016-04-10: 1 via OPHTHALMIC

## 2016-04-10 MED ORDER — MIDAZOLAM HCL 2 MG/2ML IJ SOLN
INTRAMUSCULAR | Status: AC
  Filled 2016-04-10: qty 2

## 2016-04-10 MED ORDER — DEXTROSE-NACL 5-0.9 % IV SOLN
INTRAVENOUS | Status: DC
  Administered 2016-04-10: 18:00:00 via INTRAVENOUS

## 2016-04-10 MED ORDER — SCOPOLAMINE 1 MG/3DAYS TD PT72
MEDICATED_PATCH | TRANSDERMAL | Status: DC | PRN
  Administered 2016-04-10: 1 via TRANSDERMAL

## 2016-04-10 MED ORDER — METOPROLOL TARTRATE 5 MG/5ML IV SOLN
INTRAVENOUS | Status: DC | PRN
  Administered 2016-04-10 (×4): 2.5 mg via INTRAVENOUS

## 2016-04-10 SURGICAL SUPPLY — 52 items
ADH SKN CLS APL DERMABOND .7 (GAUZE/BANDAGES/DRESSINGS) ×1
APPLIER CLIP 9.375 SM OPEN (CLIP)
CANISTER SUCTION 2500CC (MISCELLANEOUS) ×4 IMPLANT
CLEANER TIP ELECTROSURG 2X2 (MISCELLANEOUS) ×4 IMPLANT
CLIP APPLIE 9.375 SM OPEN (CLIP) IMPLANT
CONT SPEC 4OZ CLIKSEAL STRL BL (MISCELLANEOUS) ×4 IMPLANT
CORDS BIPOLAR (ELECTRODE) ×4 IMPLANT
COVER SURGICAL LIGHT HANDLE (MISCELLANEOUS) ×4 IMPLANT
DERMABOND ADVANCED (GAUZE/BANDAGES/DRESSINGS) ×2
DERMABOND ADVANCED .7 DNX12 (GAUZE/BANDAGES/DRESSINGS) ×2 IMPLANT
DRAIN CHANNEL 15F RND FF W/TCR (WOUND CARE) IMPLANT
DRAIN HEMOVAC 7FR (DRAIN) IMPLANT
DRAIN SNY 10 ROU (WOUND CARE) ×4 IMPLANT
DRAIN WOUND SNY 15 RND (WOUND CARE) IMPLANT
DRAPE INCISE 23X17 IOBAN STRL (DRAPES) ×2
DRAPE INCISE IOBAN 23X17 STRL (DRAPES) ×2 IMPLANT
ELECT COATED BLADE 2.86 ST (ELECTRODE) ×4 IMPLANT
ELECT REM PT RETURN 9FT ADLT (ELECTROSURGICAL) ×4
ELECTRODE REM PT RTRN 9FT ADLT (ELECTROSURGICAL) ×2 IMPLANT
EVACUATOR SILICONE 100CC (DRAIN) ×4 IMPLANT
FORCEPS BIPOLAR SPETZLER 8 1.0 (NEUROSURGERY SUPPLIES) ×4 IMPLANT
GAUZE SPONGE 4X4 16PLY XRAY LF (GAUZE/BANDAGES/DRESSINGS) ×20 IMPLANT
GLOVE BIO SURGEON STRL SZ 6.5 (GLOVE) ×3 IMPLANT
GLOVE BIO SURGEON STRL SZ7.5 (GLOVE) ×4 IMPLANT
GLOVE BIO SURGEONS STRL SZ 6.5 (GLOVE) ×1
GLOVE BIOGEL PI IND STRL 7.0 (GLOVE) ×2 IMPLANT
GLOVE BIOGEL PI INDICATOR 7.0 (GLOVE) ×2
GLOVE ECLIPSE 7.5 STRL STRAW (GLOVE) ×4 IMPLANT
GLOVE INDICATOR 7.0 STRL GRN (GLOVE) ×4 IMPLANT
GLOVE INDICATOR 7.5 STRL GRN (GLOVE) ×4 IMPLANT
GLOVE SURG SS PI 7.0 STRL IVOR (GLOVE) ×8 IMPLANT
GOWN STRL REUS W/ TWL LRG LVL3 (GOWN DISPOSABLE) ×8 IMPLANT
GOWN STRL REUS W/TWL LRG LVL3 (GOWN DISPOSABLE) ×16
KIT BASIN OR (CUSTOM PROCEDURE TRAY) ×4 IMPLANT
KIT ROOM TURNOVER OR (KITS) ×4 IMPLANT
NEEDLE PRECISIONGLIDE 27X1.5 (NEEDLE) ×4 IMPLANT
NS IRRIG 1000ML POUR BTL (IV SOLUTION) ×4 IMPLANT
PAD ARMBOARD 7.5X6 YLW CONV (MISCELLANEOUS) ×8 IMPLANT
PENCIL FOOT CONTROL (ELECTRODE) ×4 IMPLANT
SHEARS HARMONIC 9CM CVD (BLADE) ×4 IMPLANT
SOL PREP POV-IOD 4OZ 10% (MISCELLANEOUS) ×4 IMPLANT
SPONGE LAP 18X18 X RAY DECT (DISPOSABLE) ×4 IMPLANT
STAPLER VISISTAT 35W (STAPLE) ×4 IMPLANT
SUT CHROMIC 3 0 SH 27 (SUTURE) ×4 IMPLANT
SUT CHROMIC 4 0 PS 2 18 (SUTURE) ×8 IMPLANT
SUT ETHILON 3 0 PS 1 (SUTURE) ×4 IMPLANT
SUT SILK 3 0 SH CR/8 (SUTURE) ×4 IMPLANT
SUT SILK 4 0 REEL (SUTURE) ×8 IMPLANT
TOWEL OR 17X24 6PK STRL BLUE (TOWEL DISPOSABLE) ×4 IMPLANT
TRAY ENT MC OR (CUSTOM PROCEDURE TRAY) ×4 IMPLANT
TRAY FOLEY CATH 14FRSI W/METER (CATHETERS) ×4 IMPLANT
TUBE FEEDING 10FR FLEXIFLO (MISCELLANEOUS) IMPLANT

## 2016-04-10 NOTE — Progress Notes (Signed)
Annette Roberson is a 45 y.o. female patient admitted. Awake, alert - oriented  X 4 - no acute distress noted.  VSS - Blood pressure 118/72, pulse 88, temperature 98 F (36.7 C), temperature source Oral, resp. rate 18, height 5\' 6"  (1.676 m), weight 64 kg (141 lb), SpO2 100 %.    IV in place, occlusive dsg intact without redness.  Orientation to room, and floor completed.  Admission INP armband ID verified with patient/family, and in place.   SR up x 2, fall assessment complete, with patient and family able to verbalize understanding of risk associated with falls, and verbalized understanding to call nsg before up out of bed.  Call light within reach, patient able to voice, and demonstrate understanding. No evidence of skin break down noted on exam.  Admission nurse notified of admission.     Will cont to eval and treat per MD orders.  Theora Master, RN 04/10/2016 6:01 PM

## 2016-04-10 NOTE — Transfer of Care (Signed)
Immediate Anesthesia Transfer of Care Note  Patient: Annette Roberson  Procedure(s) Performed: Procedure(s): THYROIDECTOMY TOTAL (N/A) NECK DISSECTION (Left)  Patient Location: PACU  Anesthesia Type:General  Level of Consciousness: awake, alert  and patient cooperative  Airway & Oxygen Therapy: Patient Spontanous Breathing and Patient connected to face mask oxygen  Post-op Assessment: Report given to RN, Post -op Vital signs reviewed and stable, Patient moving all extremities X 4 and Patient able to stick tongue midline  Post vital signs: Reviewed and stable  Last Vitals:  Vitals:   04/10/16 0939 04/10/16 1407  BP: 105/71 129/78  Pulse: 83 98  Resp: 18 20  Temp: 36.7 C 37.2 C    Last Pain:  Vitals:   04/10/16 1407  TempSrc:   PainSc: Asleep         Complications: No apparent anesthesia complications

## 2016-04-10 NOTE — Progress Notes (Signed)
Patient ID: Annette Roberson, female   DOB: 1970/11/13, 45 y.o.   MRN: [id number] Postoperative check, still in PACU, awaiting room assignment.  She is awake and alert. Her voice sounds strong and healthy. Neck looks excellent. JP is functioning. Serosanguineous material.  Stable postop. Continue observation in hospital.

## 2016-04-10 NOTE — Interval H&P Note (Signed)
History and Physical Interval Note:  04/10/2016 10:56 AM  Annette Roberson  has presented today for surgery, with the diagnosis of THYROID CANCER WITH MASTESTIS TO THE OTHER SITE  The various methods of treatment have been discussed with the patient and family. After consideration of risks, benefits and other options for treatment, the patient has consented to  Procedure(s): THYROIDECTOMY TOTAL (N/A) NECK DISSECTION (Left) as a surgical intervention .  The patient's history has been reviewed, patient examined, no change in status, stable for surgery.  I have reviewed the patient's chart and labs.  Questions were answered to the patient's satisfaction.     [name]

## 2016-04-10 NOTE — Anesthesia Procedure Notes (Signed)
Procedure Name: Intubation Date/Time: 04/10/2016 11:12 AM Performed by: Annye Asa Pre-anesthesia Checklist: Patient identified, Emergency Drugs available, Suction available and Patient being monitored Patient Re-evaluated:Patient Re-evaluated prior to inductionOxygen Delivery Method: Circle system utilized Preoxygenation: Pre-oxygenation with 100% oxygen Intubation Type: IV induction Ventilation: Mask ventilation without difficulty Laryngoscope Size: Mac and 3 Grade View: Grade I Tube type: Oral Tube size: 7.0 mm Number of attempts: 1 Airway Equipment and Method: Stylet Placement Confirmation: ETT inserted through vocal cords under direct vision,  positive ETCO2 and breath sounds checked- equal and bilateral Secured at: 22 cm Tube secured with: Tape Dental Injury: Teeth and Oropharynx as per pre-operative assessment

## 2016-04-10 NOTE — Anesthesia Preprocedure Evaluation (Addendum)
Anesthesia Evaluation  Patient identified by MRN, date of birth, ID band Patient awake    Reviewed: Allergy & Precautions, NPO status , Patient's Chart, lab work & pertinent test results  History of Anesthesia Complications (+) PONV  Airway Mallampati: I  TM Distance: >3 FB     Dental  (+) Dental Advisory Given   Pulmonary neg pulmonary ROS,    breath sounds clear to auscultation       Cardiovascular negative cardio ROS   Rhythm:Regular Rate:Normal     Neuro/Psych negative neurological ROS     GI/Hepatic negative GI ROS, Neg liver ROS,   Endo/Other  negative endocrine ROS  Renal/GU negative Renal ROS     Musculoskeletal   Abdominal   Peds  Hematology negative hematology ROS (+)   Anesthesia Other Findings   Reproductive/Obstetrics                            Anesthesia Physical Anesthesia Plan  ASA: II  Anesthesia Plan: General   Post-op Pain Management:    Induction: Intravenous  Airway Management Planned: Oral ETT  Additional Equipment:   Intra-op Plan:   Post-operative Plan: Possible Post-op intubation/ventilation  Informed Consent: I have reviewed the patients History and Physical, chart, labs and discussed the procedure including the risks, benefits and alternatives for the proposed anesthesia with the patient or authorized representative who has indicated his/her understanding and acceptance.   Dental advisory given  Plan Discussed with: CRNA and Surgeon  Anesthesia Plan Comments: (Plan routine monitors, GETA  Possible post op ventilation)        Anesthesia Quick Evaluation

## 2016-04-10 NOTE — Op Note (Signed)
OPERATIVE REPORT  DATE OF SURGERY: 04/10/2016  PATIENT:  Annette Roberson,  45 y.o. female  PRE-OPERATIVE DIAGNOSIS:  THYROID CANCER WITH MASTESTIS TO THE OTHER SITE  POST-OPERATIVE DIAGNOSIS:  THYROID CANCER WITH MASTESTIS TO THE OTHER SITE  PROCEDURE:  Procedure(s): THYROIDECTOMY TOTAL NECK DISSECTION  SURGEON:  Beckie Salts, MD  ASSISTANTS: Jolene Provost PA  ANESTHESIA:   General   EBL:  50 ml  DRAINS: 10 French Hemovac  LOCAL MEDICATIONS USED:  None  SPECIMEN:  1. Total thyroidectomy, central node dissection, suture marks the right lobe in the central node compartment. 2. Left modified neck dissection including levels 3, 4 and 5  COUNTS:  Correct  PROCEDURE DETAILS: The patient was taken to the operating room and placed on the operating table in the supine position. A shoulder roll was placed beneath the shoulder blades and the neck was extended. The neck was prepped and draped in a standard fashion. A low collar transverse incision was outlined with a marking pen and extended out laterally along the left side, was incised with electrocautery . Dissection was continued down through the platysma layer.  Subplatysmal flaps were elevated superiorly and inferiorly to the clavicle. The thyroid retractor was used throughout the case.  The midline fascia was divided. The midline was shifted over towards the right. The isthmus of the thyroid was identified. The right lobe was dissected first. The superior pole vasculature was identified ligated between clamps and divided. 4-0 silk ties and harmonic dissector were used throughout the case. As the superior pole was brought down, the recurrent nerve was identified. It was preserved. Parathyroids were not identified. The dissection remained immediately adjacent to the capsule of the gland. The middle portion and inferior pole were then dissected in a similar fashion and the gland was brought forward off the trachea and the ligament of  Gwenlyn Found was divided.  Attention was then placed to the left neck. The sternal mastoid muscle was identified and the external branches of the jugular vein were ligated between clamps and divided. The fascia was dissected off of the lateral aspect of the sternocleidal mastoid muscle, the inferior half of the muscle. The fascia was dissected off posteriorly and anteriorly. The muscle was then reflected posteriorly and the dissection continued down medial to the muscle and posterior to the muscle to identify several lymph nodes that were present in the level V region. This was dissected off branches of the cervical plexus and brought forward. There was stimulation of the phrenic nerve but the nerve was felt to be preserved. The deep fascia was kept intact.   The specimen was brought forward and the jugular vein was identified. Careful dissection cleaned all of the fibrofatty tissue off the jugular vein. The superior aspect of the dissection was approximately the level of the hyoid. The internal jugular vein, carotid artery and vagus nerve were all identified and preserved. Fatty tissue was dissected off of the carotid sheath contents. The dissection continued all the way down inferiorly to the level of the clavicle. Soft tissue in the area of the thoracic duct ligated between clamps and divided. Silk ligatures were used in this area to avoid chyle leak. No chyle was identified. The graft were multiple large lymph nodes including 2 dominant lymph nodes in level IV that were dissected off of the jugular and brought forward. The omohyoid muscle was sacrificed. The neck dissection specimen was removed and sent for pathologic evaluation separately.  The left lobe of the thyroid and  central node compartment was then dissected. Within the left lobe there was a 2 cm firm nodule. The superior pole vasculature was ligated between clamps and divided. The superior pole was brought inferiorly. The parathyroid gland was not  identified on this side either. Dissection remained immediately adjacent to the capsule. The pyramidal lobe was dissected and brought inferiorly. The middle and inferior vasculature was also ligated between clamps and divided bringing the gland forward off of the trachea. The entire gland was then dissected off of the trachea at the isthmus.  Inspection of the central compartment revealed multiple very dark colored enlarged firm nodes. This area was dissected using ligatures and harmonic dissector as needed. The entire central compartment was cleaned of all of the lymph node material. The recurrent nerve was followed all the way down into the thoracic inlet and was preserved. Parathyroids were not identified in this area either.  The wound was inspected and any bleeding was treated using bipolar cautery or electrocautery as needed. Valsalva maneuver was used to identify any additional bleeding or chyle leak and none was noted.  The drain was secured in place with a nylon suture.  The platysma layer was also reapproximated with interrupted chromic suture. A running subcuticular closure was accomplished. Dermabond was used on the skin. The drain was charged. The patient was awakened, extubated and transferred to recovery in stable condition.   PATIENT DISPOSITION:  To PACU, stable

## 2016-04-10 NOTE — Anesthesia Postprocedure Evaluation (Signed)
Anesthesia Post Note  Patient: Annette Roberson  Procedure(s) Performed: Procedure(s) (LRB): THYROIDECTOMY TOTAL (N/A) NECK DISSECTION (Left)  Patient location during evaluation: PACU Anesthesia Type: General Level of consciousness: oriented, patient cooperative and sedated Pain management: pain level controlled Vital Signs Assessment: post-procedure vital signs reviewed and stable Respiratory status: spontaneous breathing, nonlabored ventilation and respiratory function stable Cardiovascular status: blood pressure returned to baseline and stable Postop Assessment: no signs of nausea or vomiting Anesthetic complications: no       Last Vitals:  Vitals:   04/10/16 1525 04/10/16 1535  BP: 125/78 125/78  Pulse: 84 88  Resp: 18 18  Temp:      Last Pain:  Vitals:   04/10/16 1600  TempSrc:   PainSc: Asleep                 Hansini Clodfelter,E. Analyce Tavares

## 2016-04-10 NOTE — Progress Notes (Signed)
CRNA Georgina Snell came to bedside in PACU regarding small reddened spot on right eye. Patient denies any pain to the eye. Georgina Snell spoke to Dr Ermalene Postin and states that as long as she is not having any pain to the eye- no treatment needed. No new orders given. Will continue to monitor.

## 2016-04-10 NOTE — H&P (View-Only) (Signed)
Annette Roberson is a 45 y.o. female who presents as a consult Patient.   Referring Provider: Dossie Arbour*  Chief complaint: Thyroid cancer.  HPI: She was found on routine dental exam a few months ago to have a small lump in her left lower neck. 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 She had no symptoms related to this. She is otherwise in good health.  PMH/Meds/All/SocHx/FamHx/ROS:   Past Medical History:  Diagnosis Date  . Hearing loss  . Migraines   Past Surgical History:  Procedure Laterality Date  . VARICOSE VEIN SURGERY   No family history of bleeding disorders, wound healing problems or difficulty with anesthesia.   Social History   Social History  . Marital status: Married  Spouse name: N/A  . Number of children: N/A  . Years of education: N/A   Occupational History  . Not on file.   Social History Main Topics  . Smoking status: Never Smoker  . Smokeless tobacco: Never Used  . Alcohol use No  . Drug use: Unknown  . Sexual activity: Not on file   Other Topics Concern  . Not on file   Social History Narrative  . No narrative on file   Current Outpatient Prescriptions:  . amitriptyline (ELAVIL) 50 MG tablet, , Disp: , Rfl:  . cholecalciferol, vitamin D3, 1,000 unit capsule, Take 1,000 Units by mouth., Disp: , Rfl:  . eletriptan (RELPAX) 40 MG tablet, , Disp: , Rfl:  . multivitamin,tx-minerals (VITAMINS AND MINERALS) Tab, Take 1 tablet by mouth., Disp: , Rfl:  . norethindrone-ethinyl estradiol (MICROGESTIN 1/20) 1-20 mg-mcg per tablet, , Disp: , Rfl:  . POLYETHYLENE GLYCOL 3350 (MIRALAX ORAL), Take by mouth., Disp: , Rfl:  . PSYLLIUM HUSK (METAMUCIL ORAL), Take by mouth., Disp: , Rfl:  . topiramate (TOPAMAX) 100 MG tablet, , Disp: , Rfl:   A complete ROS was performed with pertinent positives/negatives noted in the HPI. The remainder of the ROS are  negative.   Physical Exam:   BP 94/59  Ht 1.676 m (5\' 6" )  Wt 63 kg (138 lb 12.8 oz)  BMI 22.40 kg/m   General: Healthy and alert, in no distress, breathing easily. Normal affect. In a pleasant mood. Head: Normocephalic, atraumatic. No masses, or scars. Eyes: Pupils are equal, and reactive to light. Vision is grossly intact. No spontaneous or gaze nystagmus. Ears: Bilateral cerumen impaction cleaned out under the microscope. Tympanic membranes are intact, with normal landmarks and the middle ears are clear and healthy. Hearing: Grossly normal. Nose: Nasal cavities are clear with healthy mucosa, no polyps or exudate.Airways are patent. Face: No masses or scars, facial nerve function is symmetric. Oral Cavity: No mucosal abnormalities are noted. Tongue with normal mobility. Dentition appears healthy. Oropharynx: Tonsils are symmetric. There are no mucosal masses identified. Tongue base appears normal and healthy. Larynx/Hypopharynx: indirect exam reveals healthy, mobile vocal cords, without mucosal lesions in the hypopharynx or larynx. Chest: Deferred Neck: Small left thyroid nodule palpable. Left supraclavicular lymph node palpable, approximately 3 cm. No other masses palpable. Neuro: Cranial nerves II-XII will normal function. Balance: Normal gate. Other findings: none.  Independent Review of Additional Tests or Records:  none  Procedures:  Procedure note:  Indications: Cerumen impaction  Details of cerumen removal were discussed with the patient and all questions were answered.  Procedure:  Using the operating microscope, both sides were cleaned of cerumen using curettes. There was  no signs of infection. Symptoms were releaved.  She tolerated this procedure well. There were no complications.  Impression & Plans:  Metastatic thyroid carcinoma. Recommend total thyroidectomy and selective left neck dissection. Risks and benefits were discussed in detail. Risk of infection,  bleeding, chyle leak, recurrent nerve injury, airway problems, tracheostomy, hypocalcemia, wound infection, were all discussed. All questions were answered. We will schedule at her convenience.

## 2016-04-11 ENCOUNTER — Encounter (HOSPITAL_COMMUNITY): Payer: Self-pay | Admitting: Otolaryngology

## 2016-04-11 LAB — CALCIUM
CALCIUM: 7.8 mg/dL — AB (ref 8.9–10.3)
Calcium: 7.4 mg/dL — ABNORMAL LOW (ref 8.9–10.3)
Calcium: 7 mg/dL — ABNORMAL LOW (ref 8.9–10.3)

## 2016-04-11 NOTE — Progress Notes (Signed)
Patient ID: Annette Roberson, female   DOB: [birth year], 45 y.o.   MRN: [id number] Subjective: No complaints, ready to go home.  Objective: Vital signs in last 24 hours: Temp:  [98 F (36.7 C)-98.9 F (37.2 C)] 98.7 F (37.1 C) (12/22 1303) Pulse Rate:  [88-105] 105 (12/22 1303) Resp:  [18-19] 18 (12/22 1303) BP: (110-127)/(71-84) 118/76 (12/22 1303) SpO2:  [99 %-100 %] 100 % (12/22 1303) Weight change:  Last BM Date: 04/10/16  Intake/Output from previous day: 12/21 0701 - 12/22 0700 In: 2771.3 [I.V.:2771.3] Out: [id number] [Urine:1000; Drains:195; Blood:50] Intake/Output this shift: Total I/O In: 1167.5 [P.O.:820; I.V.:347.5] Out: 1680 [Urine:1600; Drains:80]  PHYSICAL EXAM: Neck looks excellent, no swelling. JP with chylous material. Voice normal, Neg Chvostek's.  Lab Results: No results for input(s): WBC, HGB, HCT, PLT in the last 72 hours. BMET  Recent Labs  04/11/16 0125 04/11/16 0934  CALCIUM 7.8* 7.4*    Studies/Results: No results found.  Medications: I have reviewed the patient's current medications.  Assessment/Plan: Will keep her ion tonight, monitor the drain output for quality and volume. No fat diet. May send her home with the drain.   LOS: 1 day   Lowella Kindley 04/11/2016, 5:08 PM

## 2016-04-12 DIAGNOSIS — C73 Malignant neoplasm of thyroid gland: Secondary | ICD-10-CM | POA: Diagnosis present

## 2016-04-12 DIAGNOSIS — R202 Paresthesia of skin: Secondary | ICD-10-CM | POA: Diagnosis not present

## 2016-04-12 DIAGNOSIS — R252 Cramp and spasm: Secondary | ICD-10-CM | POA: Diagnosis not present

## 2016-04-12 LAB — CALCIUM
CALCIUM: 6.6 mg/dL — AB (ref 8.9–10.3)
CALCIUM: 6.7 mg/dL — AB (ref 8.9–10.3)
Calcium: 7.2 mg/dL — ABNORMAL LOW (ref 8.9–10.3)
Calcium: 7.2 mg/dL — ABNORMAL LOW (ref 8.9–10.3)

## 2016-04-12 MED ORDER — SODIUM CHLORIDE 0.9 % IV SOLN
1.0000 g | Freq: Once | INTRAVENOUS | Status: AC
  Administered 2016-04-12: 1 g via INTRAVENOUS
  Filled 2016-04-12: qty 10

## 2016-04-12 MED ORDER — CALCIUM CARBONATE 1250 (500 CA) MG PO TABS
1.0000 | ORAL_TABLET | Freq: Three times a day (TID) | ORAL | Status: DC
  Administered 2016-04-12 – 2016-04-13 (×3): 500 mg via ORAL
  Filled 2016-04-12 (×3): qty 1

## 2016-04-12 NOTE — Progress Notes (Signed)
Calcium this am is 6.6.  Will inform Dr. Janace Hoard when he comes to see pt.

## 2016-04-12 NOTE — Progress Notes (Signed)
Pt states no twitching now.

## 2016-04-12 NOTE — Progress Notes (Signed)
Pt. with c/o numbness (L) clavicle area and swelling; no swelling noted and states that she can't relax her fingers( wants to make a fist). Dr. Janace Hoard on call- called and spoke with Dr.; he will be to see pt. this am and pt. Informed.

## 2016-04-12 NOTE — Progress Notes (Signed)
2 Days Post-Op  Subjective: Feels cramping opf the hands and tingling. Some swelling in the left neck.  Objective: Vital signs in last 24 hours: Temp:  [98.1 F (36.7 C)-98.7 F (37.1 C)] 98.4 F (36.9 C) (12/23 0504) Pulse Rate:  [88-105] 98 (12/23 0504) Resp:  [18] 18 (12/23 0504) BP: (109-123)/(73-79) 112/79 (12/23 0504) SpO2:  [98 %-100 %] 98 % (12/23 0504) Last BM Date: 04/10/16  Intake/Output from previous day: 12/22 0701 - 12/23 0700 In: 1497.5 [P.O.:1150; I.V.:347.5] Out: 1695 [Urine:1600; Drains:95] Intake/Output this shift: No intake/output data recorded.  awake and alert. neck with some swelling in the left lower neck which is soft and nontender. drain is working and there is some milky fluid that is minmal.   Lab Results:  No results for input(s): WBC, HGB, HCT, PLT in the last 72 hours. BMET  Recent Labs  04/11/16 1917 04/12/16 0119  CALCIUM 7.0* 6.6*   PT/INR No results for input(s): LABPROT, INR in the last 72 hours. ABG No results for input(s): PHART, HCO3 in the last 72 hours.  Invalid input(s): PCO2, PO2  Studies/Results: No results found.  Anti-infectives: Anti-infectives    None      Assessment/Plan: s/p Procedure(s): THYROIDECTOMY TOTAL (N/A) NECK DISSECTION (Left) she has a chyle leak which is small. she needs a medium chain triglyceride diet. she also seems to be symptomatic from low calcium. Will give calcium glucanate and Os cal. check calcium is few hours.  LOS: 1 day    Annette Roberson 04/12/2016

## 2016-04-12 NOTE — Progress Notes (Signed)
Call placed to Dr. Janace Hoard to see if pt could go home per pt request, Ca 7.2 now.

## 2016-04-12 NOTE — Progress Notes (Signed)
Nutrition Education Note  RD received consult for low fat diet education due to chyle leak.  Wt Readings from Last 15 Encounters:  04/10/16 141 lb (64 kg)  04/04/16 141 lb (64 kg)  02/15/16 140 lb (63.5 kg)   Pt not in room at time of visit. RD left "Fat Restricted Nutrition Therapy" handout from [facility] Nutrition Care Manual on bedside table. RD contact information also provided. Per MD notes, likely discharge to home today.  Body mass index is 22.76 kg/m. Patient meets criteria for normal weight range based on current BMI.   Current diet order is Heart Healthy, patient is consuming approximately 65-100% of meals at this time. Labs and medications reviewed.   No nutrition interventions warranted at this time. If nutrition issues arise, please consult RD.   Annette Roberson A. Jimmye Norman, RD, LDN, CDE Pager: [phone] After hours Pager: [phone]

## 2016-04-13 LAB — CALCIUM
Calcium: 6.7 mg/dL — ABNORMAL LOW (ref 8.9–10.3)
Calcium: 6.9 mg/dL — ABNORMAL LOW (ref 8.9–10.3)

## 2016-04-13 MED ORDER — CALCIUM CARBONATE 1250 (500 CA) MG PO TABS: 1.0000 | ORAL_TABLET | Freq: Three times a day (TID) | ORAL | 0 refills | Status: AC

## 2016-04-13 NOTE — Discharge Instructions (Signed)
You may shower and use soap and water. Do not use any creams, oils or ointment. Take your Os-Cal pill 2 pills which would be 1200 mg total 3 times a day. Also continue the vitamin D pills. Empty the drain about every 8 hours and record the amount of drainage. Call if the wound is starting to swell or any other problems including the cramping, tingling, or numbness of any area of your body. As previously noted this is your symptom of low calcium so  call or return to the emergency room if that occurs. Follow-up on Tuesday for the drain removal.

## 2016-04-13 NOTE — Progress Notes (Signed)
Notified Dr. Janace Hoard of patient's calcium level 6.7.  Will get another calcium level at 0600. Pt is asymptomatic at this time.

## 2016-04-13 NOTE — Discharge Summary (Signed)
Physician Discharge Summary  Patient ID: Annette Roberson MRN: [id number] DOB/AGE: 12/19/1970 64 y.o.  Admit date: 04/10/2016 Discharge date: 04/13/2016  Admission Diagnoses: thyroidectomy  Discharge Diagnoses: same Active Problems:   Thyroid cancer [facility])   Discharged Condition: good  Hospital Course: Patient was admitted status post thyroidectomy with neck dissection. She had a low calcium from 9-6.7. She was started on calcium. Despite the calcium of 600 she still dropped her calcium and became symptomatic with tingling of her hands and cramping of the hands. This was stopped with 1 g of calcium gluconate. She was continued on the Os-Cal and has done well with no further symptoms but her calcium still is approximately 7. The last calcium is still pending. She also had a small chyle leak.. This has essentially stopped as the drainage from the JP drain is almost 0 at this point. No longer looks milky color. She is to be discharged if her calcium remains and she will continue on Os-Cal 1200 mg 3 times a day and follow-up on Tuesday for the drain removal.   Consults: None  Significant Diagnostic Studies: None  Treatments: As above  Discharge Exam: Blood pressure 122/90, pulse 90, temperature 97.5 F (36.4 C), temperature source Oral, resp. rate 17, height 5\' 6"  (1.676 m), weight 64 kg (141 lb), SpO2 100 %. Awake and alert. No cramping or spasms. No twitching. Her voice is normal. The JP drain has no condyle present and minimal drainage. The wound looks excellent with no evidence of swelling or collection of any type of fluid. Heart is regular. Lungs are clear. Extremities no tenderness or swelling.  Disposition:     Follow-up Information    ROSEN, JEFRY, MD. Schedule an appointment as soon as possible for a visit in 1 week.   Specialty:  Otolaryngology Contact information: [address] [place] [address] [phone]           Signed: Breeze Montane 04/13/2016, 6:25 AM

## 2016-04-13 NOTE — Progress Notes (Signed)
Notified Dr. Janace Hoard that patient's Ca++ is 6.9 this am.  Per Dr. Janace Hoard, patient may be discharged this am.

## 2016-04-16 ENCOUNTER — Other Ambulatory Visit: Payer: Self-pay | Admitting: Otolaryngology

## 2016-04-18 ENCOUNTER — Inpatient Hospital Stay (HOSPITAL_COMMUNITY): Payer: 59

## 2016-04-18 ENCOUNTER — Encounter (HOSPITAL_COMMUNITY): Payer: Self-pay | Admitting: General Practice

## 2016-04-18 ENCOUNTER — Inpatient Hospital Stay (HOSPITAL_COMMUNITY)
Admission: AD | Admit: 2016-04-18 | Discharge: 2016-04-23 | DRG: 983 | Disposition: A | Discharge status: Home / Self Care | Payer: 59 | Source: Ambulatory Visit | Attending: Otolaryngology | Admitting: Otolaryngology

## 2016-04-18 ENCOUNTER — Other Ambulatory Visit: Payer: Self-pay | Admitting: Otolaryngology

## 2016-04-18 DIAGNOSIS — L0211 Cutaneous abscess of neck: Secondary | ICD-10-CM | POA: Diagnosis present

## 2016-04-18 DIAGNOSIS — Z85850 Personal history of malignant neoplasm of thyroid: Secondary | ICD-10-CM

## 2016-04-18 DIAGNOSIS — Z79899 Other long term (current) drug therapy: Secondary | ICD-10-CM | POA: Diagnosis not present

## 2016-04-18 DIAGNOSIS — L0390 Cellulitis, unspecified: Secondary | ICD-10-CM

## 2016-04-18 DIAGNOSIS — L03221 Cellulitis of neck: Secondary | ICD-10-CM | POA: Diagnosis present

## 2016-04-18 DIAGNOSIS — I898 Other specified noninfective disorders of lymphatic vessels and lymph nodes: Secondary | ICD-10-CM | POA: Diagnosis present

## 2016-04-18 LAB — CBC
HEMATOCRIT: 38.7 % (ref 36.0–46.0)
Hemoglobin: 13.6 g/dL (ref 12.0–15.0)
MCH: 31.2 pg (ref 26.0–34.0)
MCHC: 35.1 g/dL (ref 30.0–36.0)
MCV: 88.8 fL (ref 78.0–100.0)
Platelets: 252 10*3/uL (ref 150–400)
RBC: 4.36 MIL/uL (ref 3.87–5.11)
RDW: 13.8 % (ref 11.5–15.5)
WBC: 17 10*3/uL — ABNORMAL HIGH (ref 4.0–10.5)

## 2016-04-18 LAB — BASIC METABOLIC PANEL
Anion gap: 9 (ref 5–15)
BUN: 11 mg/dL (ref 6–20)
CO2: 21 mmol/L — ABNORMAL LOW (ref 22–32)
Calcium: 7.2 mg/dL — ABNORMAL LOW (ref 8.9–10.3)
Chloride: 105 mmol/L (ref 101–111)
Creatinine, Ser: 0.99 mg/dL (ref 0.44–1.00)
GFR calc Af Amer: >60 mL/min (ref >60)
GLUCOSE: 180 mg/dL — AB (ref 65–99)
Potassium: 3.7 mmol/L (ref 3.5–5.1)
SODIUM: 135 mmol/L (ref 135–145)

## 2016-04-18 MED ORDER — NORETHINDRONE ACET-ETHINYL EST 1-20 MG-MCG PO TABS: 1.0000 | ORAL_TABLET | Freq: Every day | ORAL | Status: DC

## 2016-04-18 MED ORDER — VITAMIN D 1000 UNITS PO TABS
1000.0000 [IU] | ORAL_TABLET | Freq: Every day | ORAL | Status: DC
  Administered 2016-04-18 – 2016-04-22 (×5): 1000 [IU] via ORAL
  Filled 2016-04-18 (×5): qty 1

## 2016-04-18 MED ORDER — TOPIRAMATE 100 MG PO TABS
100.0000 mg | ORAL_TABLET | Freq: Two times a day (BID) | ORAL | Status: DC
  Administered 2016-04-18 – 2016-04-23 (×9): 100 mg via ORAL
  Filled 2016-04-18 (×9): qty 1

## 2016-04-18 MED ORDER — DEXTROSE-NACL 5-0.45 % IV SOLN: INTRAVENOUS | Status: DC

## 2016-04-18 MED ORDER — CALCIUM CARBONATE 1250 (500 CA) MG PO TABS
1.0000 | ORAL_TABLET | Freq: Three times a day (TID) | ORAL | Status: DC
  Administered 2016-04-18 – 2016-04-23 (×13): 500 mg via ORAL
  Filled 2016-04-18 (×13): qty 1

## 2016-04-18 MED ORDER — SODIUM CHLORIDE 0.9 % IV SOLN: 3.0000 g | Freq: Four times a day (QID) | INTRAVENOUS | Status: DC

## 2016-04-18 MED ORDER — LEVOTHYROXINE SODIUM 100 MCG PO TABS: 100.0000 ug | ORAL_TABLET | Freq: Every day | ORAL | 6 refills | Status: DC

## 2016-04-18 MED ORDER — LEVOTHYROXINE SODIUM 100 MCG PO TABS
100.0000 ug | ORAL_TABLET | Freq: Every day | ORAL | Status: DC
  Administered 2016-04-19 – 2016-04-23 (×5): 100 ug via ORAL
  Filled 2016-04-18 (×5): qty 1

## 2016-04-18 MED ORDER — IOPAMIDOL (ISOVUE-300) INJECTION 61%
INTRAVENOUS | Status: AC
  Filled 2016-04-18: qty 75

## 2016-04-18 MED ORDER — SODIUM CHLORIDE 0.9 % IV SOLN
3.0000 g | Freq: Four times a day (QID) | INTRAVENOUS | Status: DC
  Administered 2016-04-18 – 2016-04-23 (×18): 3 g via INTRAVENOUS
  Filled 2016-04-18 (×23): qty 3

## 2016-04-18 MED ORDER — ELETRIPTAN HYDROBROMIDE 40 MG PO TABS
40.0000 mg | ORAL_TABLET | ORAL | Status: DC | PRN
  Administered 2016-04-21 (×2): 40 mg via ORAL
  Filled 2016-04-18 (×2): qty 1

## 2016-04-18 MED ORDER — IOPAMIDOL (ISOVUE-300) INJECTION 61%
INTRAVENOUS | Status: AC
  Administered 2016-04-18: 21:00:00
  Filled 2016-04-18: qty 75

## 2016-04-18 MED ORDER — DEXTROSE-NACL 5-0.45 % IV SOLN
INTRAVENOUS | Status: DC
  Administered 2016-04-18: 18:00:00 via INTRAVENOUS

## 2016-04-18 MED ORDER — HYDROCODONE-ACETAMINOPHEN 7.5-325 MG PO TABS
1.0000 | ORAL_TABLET | Freq: Four times a day (QID) | ORAL | Status: DC | PRN
  Administered 2016-04-20 (×2): 1 via ORAL
  Filled 2016-04-18 (×2): qty 1

## 2016-04-18 MED ORDER — AMITRIPTYLINE HCL 50 MG PO TABS
100.0000 mg | ORAL_TABLET | Freq: Every day | ORAL | Status: DC
  Administered 2016-04-18 – 2016-04-22 (×5): 100 mg via ORAL
  Filled 2016-04-18 (×6): qty 2

## 2016-04-18 NOTE — H&P (Signed)
Annette Roberson is an 45 y.o. female.   Chief Complaint: Anterior neck swelling and cellulitis HPI: Patient admitted for increasing symptoms of anterior neck swelling and possible cellulitis. She underwent total thyroidectomy and left neck dissection on 04/10/16 for papillary or some of the thyroid. The patient had some initial leakage of fluid consistent with possible chyle leak. Is resolved and drain was removed without difficulty. The patient has a several day history of increasing swelling and erythema along her incision line. She was started on oral clindamycin without improvement in symptoms.  Past Medical History:  Diagnosis Date  . Family history of adverse reaction to anesthesia    "[name]" (04/10/2016)  . Migraines    "monthly" (04/10/2016)  . Papillary thyroid carcinoma (Annette Roberson)   . PONV (postoperative nausea and vomiting)   . Thyroid nodule     Past Surgical History:  Procedure Laterality Date  . NECK DISSECTION Left 04/10/2016  . RADICAL NECK DISSECTION Left 04/10/2016   Procedure: NECK DISSECTION;  Surgeon: Annette Gala, MD;  Location: [place];  Service: ENT;  Laterality: Left;  . THYROIDECTOMY  04/10/2016  . THYROIDECTOMY N/A 04/10/2016   Procedure: THYROIDECTOMY TOTAL;  Surgeon: Annette Gala, MD;  Location: [place];  Service: ENT;  Laterality: N/A;  . TONSILLECTOMY    . TYMPANOSTOMY TUBE PLACEMENT Bilateral   . VARICOSE VEIN SURGERY Bilateral 2000s    No family history on file. Social History:  reports that she has never smoked. She has never used smokeless tobacco. She reports that she does not drink alcohol or use drugs.  Allergies: No Known Allergies  Facility-Administered Medications Prior to Admission  Medication Dose Route Frequency Provider Last Rate Last Dose  . Ampicillin-Sulbactam (UNASYN) 3 g in sodium chloride 0.9 % 100 mL IVPB  3 g Intravenous Q6H Annette Gala, MD      . dextrose 5 %-0.45 % sodium chloride infusion   Intravenous Continuous Annette Gala, MD       Medications Prior to Admission  Medication Sig Dispense Refill  . amitriptyline (ELAVIL) 50 MG tablet TAKE 1 TABLET AT BEDTIME (Patient taking differently: TAKE 100mg  TABLET AT BEDTIME) 30 tablet 0  . calcium carbonate (OS-CAL - DOSED IN MG OF ELEMENTAL CALCIUM) 1250 (500 Ca) MG tablet Take 1 tablet (500 mg of elemental calcium total) by mouth 3 (three) times daily with meals. 90 tablet 0  . Calcium-Vitamin D ([employer] CALCIUM-600/VIT D PO) Take 1 tablet by mouth daily.    . Cholecalciferol (VITAMIN D3) 1000 units CAPS Take 1,000 Units by mouth at bedtime.    Marland Kitchen HYDROcodone-acetaminophen (NORCO) 7.5-325 MG tablet Take 1 tablet by mouth every 6 (six) hours as needed for moderate pain. 30 tablet 0  . levothyroxine (SYNTHROID) 100 MCG tablet Take 1 tablet (100 mcg total) by mouth daily. (Patient taking differently: Take 100 mcg by mouth daily. ) 30 tablet 6  . levothyroxine (SYNTHROID) 100 MCG tablet Take 1 tablet (100 mcg total) by mouth daily. 30 tablet 6  . Multiple Vitamin (MULTIVITAMIN WITH MINERALS) TABS tablet Take 1 tablet by mouth at bedtime.    . naproxen sodium (ANAPROX) 220 MG tablet Take 660 mg by mouth daily as needed (headaches). Will stop 2 weeks prior to procedure    . norethindrone-ethinyl estradiol (MICROGESTIN,JUNEL,LOESTRIN) 1-20 MG-MCG tablet Take 1 tablet by mouth daily.     . polyethylene glycol (MIRALAX / GLYCOLAX) packet Take 17 g by mouth daily as needed for mild constipation.    . promethazine (  PHENERGAN) 25 MG suppository Place 1 suppository (25 mg total) rectally every 6 (six) hours as needed for nausea or vomiting. 12 suppository 1  . psyllium (METAMUCIL) 58.6 % powder Take 1 packet by mouth daily as needed (constipation).     . RELPAX 40 MG tablet TAKE 1 TABLET AS NEEDED FOR HEADACHE, MAY REPEAT ONCE AFTER 30 MINUTES, MAXIMUM 2 TABLETS PER 24 HOURS, MAXIMUM 8 PER MONTH 8 tablet 0  . topiramate (TOPAMAX) 100 MG tablet TAKE 1 TABLET TWICE A DAY (NEED  APPOINTMENT) (Patient taking differently: Take 100mg s once daily at night) 60 tablet 0    No results found for this or any previous visit (from the past 48 hour(s)). No results found.  Review of Systems  Constitutional: Negative.   HENT: Negative.   Respiratory: Negative.   Cardiovascular: Negative.     Blood pressure 124/79, pulse 88, temperature (!) 100.6 F (38.1 C), temperature source Oral, resp. rate 17, height 5\' 6"  (1.676 m), weight 65 kg (143 lb 4.8 oz), SpO2 100 %. Physical Exam  Constitutional: She is oriented to person, place, and time. She appears well-developed and well-nourished.  HENT:  Head: Normocephalic.  Nose: Nose normal.  Mouth/Throat: Oropharynx is clear and moist.  Neck:  Erythematous swelling over the patient's healing thyroid and neck dissection incision.  Cardiovascular: Normal rate.   Respiratory: Effort normal.  Musculoskeletal: Normal range of motion.  Neurological: She is alert and oriented to person, place, and time.     Assessment/Plan Patient is admitted for intravenous antibiotics, blood work and CT scan of the neck. Monitor for additional symptoms of infection. Plan exploration of the anterior neck wound on 12/30 a.m. if CT scan shows significant collection of fluid.  Annette Jutte, MD 04/18/2016, 6:18 PM

## 2016-04-19 ENCOUNTER — Inpatient Hospital Stay (HOSPITAL_COMMUNITY): Payer: 59 | Admitting: Anesthesiology

## 2016-04-19 ENCOUNTER — Encounter (HOSPITAL_COMMUNITY)
Admission: AD | Disposition: A | Discharge status: Home / Self Care | Payer: Self-pay | Source: Ambulatory Visit | Attending: Otolaryngology

## 2016-04-19 ENCOUNTER — Encounter (HOSPITAL_COMMUNITY): Payer: Self-pay | Admitting: Certified Registered Nurse Anesthetist

## 2016-04-19 HISTORY — PX: WOUND EXPLORATION: SHX6188

## 2016-04-19 HISTORY — PX: INCISION AND DRAINAGE ABSCESS: SHX5864

## 2016-04-19 SURGERY — INCISION AND DRAINAGE, ABSCESS
Anesthesia: General | Site: Neck | Laterality: Right

## 2016-04-19 MED ORDER — ONDANSETRON HCL 4 MG/2ML IJ SOLN
4.0000 mg | INTRAMUSCULAR | Status: DC | PRN
  Filled 2016-04-19: qty 2

## 2016-04-19 MED ORDER — FENTANYL CITRATE (PF) 100 MCG/2ML IJ SOLN
INTRAMUSCULAR | Status: AC
  Filled 2016-04-19: qty 2

## 2016-04-19 MED ORDER — CALCIUM CARBONATE-VITAMIN D 500-200 MG-UNIT PO TABS
1.0000 | ORAL_TABLET | Freq: Three times a day (TID) | ORAL | Status: AC
  Administered 2016-04-19 – 2016-04-22 (×9): 1 via ORAL
  Filled 2016-04-19 (×9): qty 1

## 2016-04-19 MED ORDER — PROPOFOL 10 MG/ML IV BOLUS
INTRAVENOUS | Status: DC | PRN
  Administered 2016-04-19: 140 mg via INTRAVENOUS

## 2016-04-19 MED ORDER — ONDANSETRON HCL 4 MG/2ML IJ SOLN
INTRAMUSCULAR | Status: DC | PRN
  Administered 2016-04-19 (×2): 4 mg via INTRAVENOUS

## 2016-04-19 MED ORDER — FENTANYL CITRATE (PF) 100 MCG/2ML IJ SOLN: 25.0000 ug | INTRAMUSCULAR | Status: DC | PRN

## 2016-04-19 MED ORDER — LIDOCAINE-EPINEPHRINE 1 %-1:100000 IJ SOLN
INTRAMUSCULAR | Status: DC | PRN
  Administered 2016-04-19: 3 mL

## 2016-04-19 MED ORDER — DEXTROSE IN LACTATED RINGERS 5 % IV SOLN
INTRAVENOUS | Status: DC
  Administered 2016-04-19 – 2016-04-23 (×8): via INTRAVENOUS

## 2016-04-19 MED ORDER — SCOPOLAMINE 1 MG/3DAYS TD PT72
MEDICATED_PATCH | TRANSDERMAL | Status: DC | PRN
  Administered 2016-04-19: 1 via TRANSDERMAL

## 2016-04-19 MED ORDER — SUGAMMADEX SODIUM 200 MG/2ML IV SOLN
INTRAVENOUS | Status: DC | PRN
  Administered 2016-04-19: 75 mg via INTRAVENOUS

## 2016-04-19 MED ORDER — 0.9 % SODIUM CHLORIDE (POUR BTL) OPTIME
TOPICAL | Status: DC | PRN
Start: 2016-04-19 — End: 2016-04-19
  Administered 2016-04-19: 1000 mL

## 2016-04-19 MED ORDER — MIDAZOLAM HCL 5 MG/5ML IJ SOLN
INTRAMUSCULAR | Status: DC | PRN
  Administered 2016-04-19: 2 mg via INTRAVENOUS

## 2016-04-19 MED ORDER — MORPHINE SULFATE (PF) 2 MG/ML IV SOLN
2.0000 mg | INTRAVENOUS | Status: DC | PRN
  Administered 2016-04-19 – 2016-04-23 (×8): 2 mg via INTRAVENOUS
  Filled 2016-04-19 (×9): qty 1

## 2016-04-19 MED ORDER — OXYCODONE HCL 5 MG PO TABS: 5.0000 mg | ORAL_TABLET | Freq: Once | ORAL | Status: DC | PRN

## 2016-04-19 MED ORDER — SCOPOLAMINE 1 MG/3DAYS TD PT72
MEDICATED_PATCH | TRANSDERMAL | Status: AC
  Filled 2016-04-19: qty 1

## 2016-04-19 MED ORDER — FENTANYL CITRATE (PF) 100 MCG/2ML IJ SOLN
INTRAMUSCULAR | Status: DC | PRN
  Administered 2016-04-19: 25 ug via INTRAVENOUS
  Administered 2016-04-19: 50 ug via INTRAVENOUS
  Administered 2016-04-19: 100 ug via INTRAVENOUS
  Administered 2016-04-19: 50 ug via INTRAVENOUS

## 2016-04-19 MED ORDER — ONDANSETRON HCL 4 MG PO TABS
4.0000 mg | ORAL_TABLET | ORAL | Status: DC | PRN
  Administered 2016-04-19 – 2016-04-20 (×3): 4 mg via ORAL
  Filled 2016-04-19 (×3): qty 1

## 2016-04-19 MED ORDER — LIDOCAINE-EPINEPHRINE 1 %-1:100000 IJ SOLN
20.0000 mL | Freq: Once | INTRAMUSCULAR | Status: DC
  Filled 2016-04-19: qty 20

## 2016-04-19 MED ORDER — MIDAZOLAM HCL 2 MG/2ML IJ SOLN
INTRAMUSCULAR | Status: AC
  Filled 2016-04-19: qty 2

## 2016-04-19 MED ORDER — LIDOCAINE 2% (20 MG/ML) 5 ML SYRINGE
INTRAMUSCULAR | Status: DC | PRN
  Administered 2016-04-19: 60 mg via INTRAVENOUS

## 2016-04-19 MED ORDER — HEPARIN SODIUM (PORCINE) 5000 UNIT/ML IJ SOLN
5000.0000 [IU] | Freq: Three times a day (TID) | INTRAMUSCULAR | Status: DC
  Administered 2016-04-19 – 2016-04-23 (×11): 5000 [IU] via SUBCUTANEOUS
  Filled 2016-04-19 (×10): qty 1

## 2016-04-19 MED ORDER — ROCURONIUM BROMIDE 10 MG/ML (PF) SYRINGE
PREFILLED_SYRINGE | INTRAVENOUS | Status: DC | PRN
  Administered 2016-04-19: 40 mg via INTRAVENOUS

## 2016-04-19 MED ORDER — OXYCODONE HCL 5 MG/5ML PO SOLN: 5.0000 mg | Freq: Once | ORAL | Status: DC | PRN

## 2016-04-19 MED ORDER — LACTATED RINGERS IV SOLN
INTRAVENOUS | Status: DC | PRN
  Administered 2016-04-19: 10:00:00 via INTRAVENOUS

## 2016-04-19 MED ORDER — PROPOFOL 10 MG/ML IV BOLUS
INTRAVENOUS | Status: AC
  Filled 2016-04-19: qty 20

## 2016-04-19 MED ORDER — LEVOTHYROXINE SODIUM 100 MCG PO TABS: 100.0000 ug | ORAL_TABLET | Freq: Every day | ORAL | Status: DC

## 2016-04-19 MED ORDER — LIDOCAINE-EPINEPHRINE (PF) 1 %-1:200000 IJ SOLN
30.0000 mL | Freq: Once | INTRAMUSCULAR | Status: DC
  Filled 2016-04-19: qty 30

## 2016-04-19 MED ORDER — BACITRACIN ZINC 500 UNIT/GM EX OINT
TOPICAL_OINTMENT | CUTANEOUS | Status: AC
  Filled 2016-04-19: qty 28.35

## 2016-04-19 SURGICAL SUPPLY — 55 items
ADH SKN CLS APL DERMABOND .7 (GAUZE/BANDAGES/DRESSINGS) ×1
ATTRACTOMAT 16X20 MAGNETIC DRP (DRAPES) IMPLANT
BLADE SURG 15 STRL LF DISP TIS (BLADE) IMPLANT
BLADE SURG 15 STRL SS (BLADE)
BNDG GAUZE ELAST 4 BULKY (GAUZE/BANDAGES/DRESSINGS) IMPLANT
CANISTER SUCTION 2500CC (MISCELLANEOUS) ×3 IMPLANT
CLEANER TIP ELECTROSURG 2X2 (MISCELLANEOUS) ×3 IMPLANT
CONT SPEC 4OZ CLIKSEAL STRL BL (MISCELLANEOUS) ×3 IMPLANT
CORDS BIPOLAR (ELECTRODE) ×3 IMPLANT
COVER SURGICAL LIGHT HANDLE (MISCELLANEOUS) ×3 IMPLANT
DERMABOND ADVANCED (GAUZE/BANDAGES/DRESSINGS) ×2
DERMABOND ADVANCED .7 DNX12 (GAUZE/BANDAGES/DRESSINGS) ×1 IMPLANT
DRAIN CHANNEL 7F FF FLAT (WOUND CARE) IMPLANT
DRAIN PENROSE 1/4X12 LTX STRL (WOUND CARE) IMPLANT
DRAIN SNY 7 FPER (WOUND CARE) ×3 IMPLANT
DRAPE PROXIMA HALF (DRAPES) IMPLANT
ELECT COATED BLADE 2.86 ST (ELECTRODE) ×3 IMPLANT
ELECT REM PT RETURN 9FT ADLT (ELECTROSURGICAL) ×3
ELECT REM PT RETURN 9FT PED (ELECTROSURGICAL)
ELECTRODE REM PT RETRN 9FT PED (ELECTROSURGICAL) IMPLANT
ELECTRODE REM PT RTRN 9FT ADLT (ELECTROSURGICAL) ×1 IMPLANT
EVACUATOR SILICONE 100CC (DRAIN) IMPLANT
GAUZE SPONGE 4X4 12PLY STRL (GAUZE/BANDAGES/DRESSINGS) IMPLANT
GLOVE BIOGEL M 7.0 STRL (GLOVE) ×6 IMPLANT
GOWN STRL REUS W/ TWL LRG LVL3 (GOWN DISPOSABLE) ×2 IMPLANT
GOWN STRL REUS W/TWL LRG LVL3 (GOWN DISPOSABLE) ×6
HEMOSTAT SURGICEL .5X2 ABSORB (HEMOSTASIS) ×6 IMPLANT
KIT BASIN OR (CUSTOM PROCEDURE TRAY) ×3 IMPLANT
KIT ROOM TURNOVER OR (KITS) ×3 IMPLANT
LOCATOR NERVE 3 VOLT (DISPOSABLE) IMPLANT
NEEDLE HYPO 25GX1X1/2 BEV (NEEDLE) IMPLANT
NS IRRIG 1000ML POUR BTL (IV SOLUTION) ×3 IMPLANT
PAD ARMBOARD 7.5X6 YLW CONV (MISCELLANEOUS) ×6 IMPLANT
PENCIL BUTTON HOLSTER BLD 10FT (ELECTRODE) ×3 IMPLANT
STAPLER VISISTAT 35W (STAPLE) ×6 IMPLANT
SUT CHROMIC 3 0 SH 27 (SUTURE) ×6 IMPLANT
SUT CHROMIC 4 0 RB 1X27 (SUTURE) ×6 IMPLANT
SUT CHROMIC 4 0 SH 27 (SUTURE) ×3 IMPLANT
SUT ETHILON 3 0 PS 1 (SUTURE) ×6 IMPLANT
SUT ETHILON 5 0 PS 2 18 (SUTURE) IMPLANT
SUT SILK 2 0 FS (SUTURE) IMPLANT
SUT SILK 3 0 REEL (SUTURE) IMPLANT
SUT VIC AB 3-0 PS2 18 (SUTURE)
SUT VIC AB 3-0 PS2 18XBRD (SUTURE) IMPLANT
SUT VIC AB 4-0 P-3 18X BRD (SUTURE) IMPLANT
SUT VIC AB 4-0 P3 18 (SUTURE)
SUT VIC AB 4-0 PS2 27 (SUTURE) ×3 IMPLANT
SUT VICRYL 4-0 PS2 18IN ABS (SUTURE) ×6 IMPLANT
SWAB COLLECTION DEVICE MRSA (MISCELLANEOUS) IMPLANT
TIP NONSTICK .5MMX23CM (INSTRUMENTS) ×3
TIP NONSTICK .5X23 (INSTRUMENTS) ×1 IMPLANT
TOWEL OR 17X24 6PK STRL BLUE (TOWEL DISPOSABLE) ×3 IMPLANT
TRAY ENT MC OR (CUSTOM PROCEDURE TRAY) ×3 IMPLANT
TUBE ANAEROBIC SPECIMEN COL (MISCELLANEOUS) IMPLANT
WATER STERILE IRR 1000ML POUR (IV SOLUTION) ×3 IMPLANT

## 2016-04-19 NOTE — Anesthesia Preprocedure Evaluation (Signed)
Anesthesia Evaluation  Patient identified by MRN, date of birth, ID band Patient awake    Reviewed: Allergy & Precautions, NPO status , Patient's Chart, lab work & pertinent test results  History of Anesthesia Complications (+) PONV and history of anesthetic complications  Airway Mallampati: I  TM Distance: >3 FB     Dental  (+) Dental Advisory Given   Pulmonary neg pulmonary ROS,    breath sounds clear to auscultation       Cardiovascular negative cardio ROS   Rhythm:Regular Rate:Normal     Neuro/Psych negative neurological ROS     GI/Hepatic negative GI ROS, Neg liver ROS,   Endo/Other  negative endocrine ROS  Renal/GU negative Renal ROS     Musculoskeletal   Abdominal   Peds  Hematology negative hematology ROS (+)   Anesthesia Other Findings   Reproductive/Obstetrics                             Anesthesia Physical Anesthesia Plan  ASA: II  Anesthesia Plan: General   Post-op Pain Management:    Induction: Intravenous  Airway Management Planned: Oral ETT  Additional Equipment: None  Intra-op Plan:   Post-operative Plan: Extubation in OR  Informed Consent: I have reviewed the patients History and Physical, chart, labs and discussed the procedure including the risks, benefits and alternatives for the proposed anesthesia with the patient or authorized representative who has indicated his/her understanding and acceptance.   Dental advisory given  Plan Discussed with: CRNA and Surgeon  Anesthesia Plan Comments:         Anesthesia Quick Evaluation

## 2016-04-19 NOTE — Brief Op Note (Signed)
04/18/2016 - 04/19/2016  12:28 PM  PATIENT:  Annette Roberson  45 y.o. female  PRE-OPERATIVE DIAGNOSIS:  Neck Abscess  POST-OPERATIVE DIAGNOSIS:  Neck Abscess  PROCEDURE:   EXPLORATION OF NECK WOUND LIGATION OF CHYLE FISTULA   SURGEON:  Surgeon(s) and Role:    * Jerrell Belfast, MD - Primary  PHYSICIAN ASSISTANT:   ASSISTANTS: none   ANESTHESIA:   general  EBL:  Total I/O In: -  Out: 15 [Blood:15]  BLOOD ADMINISTERED:none  DRAINS: (27mm) Jackson-Pratt drain(s) with closed bulb suction in the neck   LOCAL MEDICATIONS USED:  LIDOCAINE  and Amount: 3 ml  SPECIMEN:  No Specimen  DISPOSITION OF SPECIMEN:  N/A  COUNTS:  YES  TOURNIQUET:  * No tourniquets in log *  DICTATION: .Other Dictation: Dictation Number [id number]  PLAN OF CARE: Admit to inpatient   PATIENT DISPOSITION:  PACU - hemodynamically stable.   Delay start of Pharmacological VTE agent (>24hrs) due to surgical blood loss or risk of bleeding: no

## 2016-04-19 NOTE — Progress Notes (Signed)
Patient has fine rash starting on bilateral back and extending around to abdomen. Patient denies itching. Family reports that they noticed it yesterday. Chest is red starting at neck and going downward patient/ family reports that this has been going on since before the other rash.

## 2016-04-19 NOTE — Anesthesia Procedure Notes (Signed)
Procedure Name: Intubation Date/Time: 04/19/2016 10:22 AM Performed by: Salli Quarry Harneet Noblett Pre-anesthesia Checklist: Patient identified, Emergency Drugs available, Suction available and Patient being monitored Patient Re-evaluated:Patient Re-evaluated prior to inductionOxygen Delivery Method: Circle System Utilized Preoxygenation: Pre-oxygenation with 100% oxygen Intubation Type: IV induction Ventilation: Mask ventilation without difficulty Laryngoscope Size: Mac and 3 Grade View: Grade II Tube type: Oral Tube size: 7.0 mm Number of attempts: 1 Airway Equipment and Method: Stylet and Oral airway Placement Confirmation: ETT inserted through vocal cords under direct vision,  positive ETCO2 and breath sounds checked- equal and bilateral Secured at: 21 cm Tube secured with: Tape Dental Injury: Teeth and Oropharynx as per pre-operative assessment

## 2016-04-19 NOTE — Transfer of Care (Signed)
Immediate Anesthesia Transfer of Care Note  Patient: Annette Roberson  Procedure(s) Performed: Procedure(s): INCISION AND DRAINAGE ABSCESS (Right) WOUND EXPLORATION (Right)  Patient Location: PACU  Anesthesia Type:General  Level of Consciousness: awake, alert  and oriented  Airway & Oxygen Therapy: Patient Spontanous Breathing  Post-op Assessment: Report given to RN and Post -op Vital signs reviewed and stable  Post vital signs: Reviewed and stable  Last Vitals:  Vitals:   04/19/16 0621 04/19/16 0835  BP: 116/78 118/78  Pulse: (!) 120 (!) 120  Resp: 16 16  Temp: 37 C 37.5 C    Last Pain:  Vitals:   04/19/16 0835  TempSrc: Oral  PainSc:          Complications: No apparent anesthesia complications

## 2016-04-19 NOTE — Progress Notes (Signed)
   ENT Progress Note: HD#1   Subjective: Increased swelling  Objective: Vital signs in last 24 hours: Temp:  [98.6 F (37 C)-100.6 F (38.1 C)] 98.6 F (37 C) (12/30 0621) Pulse Rate:  [88-132] 120 (12/30 0621) Resp:  [16-17] 16 (12/30 0621) BP: (116-124)/(73-79) 116/78 (12/30 0621) SpO2:  [99 %-100 %] 99 % (12/30 0621) Weight:  [65 kg (143 lb 4.8 oz)] 65 kg (143 lb 4.8 oz) (12/29 1647) Weight change:     Intake/Output from previous day: 12/29 0701 - 12/30 0700 In: 1696.3 [P.O.:840; I.V.:656.3; IV Piggyback:200] Out: -  Intake/Output this shift: No intake/output data recorded.  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 Neck abscess. EXAM: CT NECK WITH CONTRAST TECHNIQUE: Multidetector CT imaging of the neck was performed using the standard protocol following the bolus administration of intravenous contrast. CONTRAST:  75 cc Isovue-300 COMPARISON:  CT 12/28/2015.  Ultrasound 03/06/2016. FINDINGS: The patient has had thyroidectomy. At the level of the larynx and above, the neck appears normal. Below that, there is abnormal low-density fluid extending throughout the tissue planes of the lower neck, more extensively on the left than the right, but with some extension on the right as well. This extends into the superior mediastinum as well. By clinical history, this is abscess. Fluid surrounds the sternocleidomastoid muscle on the left, the strap muscles bilaterally, and partially extends around the sternocleidomastoid muscle on the right. This fluid surrounds the left carotid artery and the left internal jugular vein. The lower aspect of the internal jugular vein is  narrowed but probably does remain patent to join the subclavian vein and form the innominate vein. Fluid on the right side of the neck does extend adjacent to the common carotid artery and jugular vein but does not completely surround them as is the case on the left. IMPRESSION: Previous thyroidectomy. Low-density fluid within the anterior neck more extensively on the left than the right consistent with abscess. This process extends from the level of the larynx down into the superior mediastinum. Low-density fluid surrounds the left sternocleidomastoid muscle and the strap muscles and partially surrounds the right sternocleidomastoid muscle. This fluid surrounds the left common carotid artery and the left internal jugular vein and markedly narrows the lower aspect of the internal jugular vein. This fluid does contact the vessels in the right neck but does not completely surround them. Electronically Signed   By: Nelson Chimes M.D.   On: 04/18/2016 21:25     PHYSICAL EXAM: Swelling and erythema of incision, some leakeage.   Assessment/Plan: Pt with post-surgical fluid collection, possible abscess vs chyle leak. Plan I&D with exploration of neck incision.     [place], Yovana Scogin 04/19/2016, 8:28 AM

## 2016-04-19 NOTE — Anesthesia Postprocedure Evaluation (Signed)
Anesthesia Post Note  Patient: Annette Roberson  Procedure(s) Performed: Procedure(s) (LRB): INCISION AND DRAINAGE ABSCESS (Right) WOUND EXPLORATION (Right)  Patient location during evaluation: PACU Anesthesia Type: General Level of consciousness: awake and alert Pain management: pain level controlled Vital Signs Assessment: post-procedure vital signs reviewed and stable Respiratory status: spontaneous breathing, nonlabored ventilation, respiratory function stable and patient connected to nasal cannula oxygen Cardiovascular status: blood pressure returned to baseline and stable Postop Assessment: no signs of nausea or vomiting Anesthetic complications: no       Last Vitals:  Vitals:   04/19/16 1315 04/19/16 1333  BP:  117/83  Pulse: (!) 117 (!) 114  Resp: (!) 25 16  Temp: 36.5 C 37.1 C    Last Pain:  Vitals:   04/19/16 1431  TempSrc:   PainSc: 5                  Kayman Snuffer

## 2016-04-20 ENCOUNTER — Encounter (HOSPITAL_COMMUNITY): Payer: Self-pay | Admitting: Otolaryngology

## 2016-04-20 LAB — CBC WITH DIFFERENTIAL/PLATELET
BASOS ABS: 0 10*3/uL (ref 0.0–0.1)
BASOS PCT: 0 %
EOS PCT: 3 %
Eosinophils Absolute: 0.4 10*3/uL (ref 0.0–0.7)
HCT: 35.9 % — ABNORMAL LOW (ref 36.0–46.0)
Hemoglobin: 12.7 g/dL (ref 12.0–15.0)
LYMPHS PCT: 15 %
Lymphs Abs: 2 10*3/uL (ref 0.7–4.0)
MCH: 31.1 pg (ref 26.0–34.0)
MCHC: 35.4 g/dL (ref 30.0–36.0)
MCV: 88 fL (ref 78.0–100.0)
Monocytes Absolute: 1.2 10*3/uL — ABNORMAL HIGH (ref 0.1–1.0)
Monocytes Relative: 9 %
NEUTROS ABS: 9.6 10*3/uL — AB (ref 1.7–7.7)
Neutrophils Relative %: 73 %
PLATELETS: 284 10*3/uL (ref 150–400)
RBC: 4.08 MIL/uL (ref 3.87–5.11)
RDW: 14 % (ref 11.5–15.5)
WBC: 13.1 10*3/uL — ABNORMAL HIGH (ref 4.0–10.5)

## 2016-04-20 NOTE — Progress Notes (Signed)
   ENT Progress Note: POD #1 s/p Procedure(s): INCISION AND DRAINAGE ABSCESS WOUND EXPLORATION   Subjective: Mild pain   Objective: Vital signs in last 24 hours: Temp:  [97.7 F (36.5 C)-100 F (37.8 C)] 98.1 F (36.7 C) (12/31 0431) Pulse Rate:  [112-125] 112 (12/31 0431) Resp:  [16-25] 19 (12/31 0431) BP: (112-130)/(67-88) 112/76 (12/31 0431) SpO2:  [96 %-100 %] 100 % (12/31 0431) Weight change:  Last BM Date: 04/17/16  Intake/Output from previous day: 12/30 0701 - 12/31 0700 In: 2053.7 [P.O.:222; I.V.:1231.7; IV Piggyback:600] Out: 1875 [Urine:1800; Drains:60; Blood:15] Intake/Output this shift: Total I/O In: -  Out: 550 [Urine:550]  Labs:  Recent Labs  04/18/16 2020 04/20/16 0542  WBC 17.0* 13.1*  HGB 13.6 12.7  HCT 38.7 35.9*  PLT 252 284    Recent Labs  04/18/16 2020  NA 135  K 3.7  CL 105  CO2 21*  GLUCOSE 180*  BUN 11  CALCIUM 7.2*    Studies/Results: Ct Soft Tissue Neck W Contrast  Result Date: 04/18/2016 CLINICAL DATA:  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 Neck abscess. EXAM: CT NECK WITH CONTRAST TECHNIQUE: Multidetector CT imaging of the neck was performed using the standard protocol following the bolus administration of intravenous contrast. CONTRAST:  75 cc Isovue-300 COMPARISON:  CT 12/28/2015.  Ultrasound 03/06/2016. FINDINGS: The patient has had thyroidectomy. At the level of the larynx and above, the neck appears normal. Below that, there is abnormal low-density fluid extending throughout the tissue planes of the lower neck, more extensively on the left than the right, but with some extension on the right as well. This extends into the superior mediastinum as well. By clinical history, this is abscess. Fluid surrounds the sternocleidomastoid muscle on the left, the strap muscles bilaterally, and partially extends around the sternocleidomastoid muscle on the right. This fluid surrounds the left carotid  artery and the left internal jugular vein. The lower aspect of the internal jugular vein is narrowed but probably does remain patent to join the subclavian vein and form the innominate vein. Fluid on the right side of the neck does extend adjacent to the common carotid artery and jugular vein but does not completely surround them as is the case on the left. IMPRESSION: Previous thyroidectomy. Low-density fluid within the anterior neck more extensively on the left than the right consistent with abscess. This process extends from the level of the larynx down into the superior mediastinum. Low-density fluid surrounds the left sternocleidomastoid muscle and the strap muscles and partially surrounds the right sternocleidomastoid muscle. This fluid surrounds the left common carotid artery and the left internal jugular vein and markedly narrows the lower aspect of the internal jugular vein. This fluid does contact the vessels in the right neck but does not completely surround them. Electronically Signed   By: Nelson Chimes M.D.   On: 04/18/2016 21:25     PHYSICAL EXAM: Decreased swelling and erythema JP outpt is 65cc x 24hr    Assessment/Plan: Stable postop Cont po liquids only, IVF and IV abx Monitor JP and wound    Tanaysia Bhardwaj 04/20/2016, 8:22 AM

## 2016-04-21 NOTE — Progress Notes (Signed)
   ENT Progress Note: POD #2 s/p Procedure(s): INCISION AND DRAINAGE ABSCESS WOUND EXPLORATION   Subjective: No pain  Objective: Vital signs in last 24 hours: Temp:  [97.5 F (36.4 C)-98.7 F (37.1 C)] 97.5 F (36.4 C) (01/01 0446) Pulse Rate:  [99-113] 99 (01/01 0446) Resp:  [18-19] 19 (01/01 0446) BP: (115-125)/(78-80) 115/80 (01/01 0446) SpO2:  [98 %-100 %] 100 % (01/01 0446) Weight:  [65 kg (143 lb 4.8 oz)] 65 kg (143 lb 4.8 oz) (01/01 0446) Weight change:  Last BM Date: 04/17/16  Intake/Output from previous day: 12/31 0701 - 01/01 0700 In: 2160 [P.O.:960; I.V.:1000; IV Piggyback:200] Out: 1550 [Urine:1500; Drains:50] Intake/Output this shift: Total I/O In: 360 [P.O.:360] Out: -   Labs:  Recent Labs  04/18/16 2020 04/20/16 0542  WBC 17.0* 13.1*  HGB 13.6 12.7  HCT 38.7 35.9*  PLT 252 284    Recent Labs  04/18/16 2020  NA 135  K 3.7  CL 105  CO2 21*  GLUCOSE 180*  BUN 11  CALCIUM 7.2*    Studies/Results: No results found.   PHYSICAL EXAM: Incision stable - decreased erythema JP 50cc clear fluid   Assessment/Plan: Stable Cont current tx: abx, po liquids only and monitor drain Anticipate d/c 04/23/16 if stable     Sanjay Broadfoot 04/21/2016, 12:17 PM

## 2016-04-22 NOTE — Progress Notes (Signed)
   ENT Progress Note: POD #3 s/p Procedure(s): INCISION AND DRAINAGE ABSCESS WOUND EXPLORATION   Subjective: Stable, no complaints  Objective: Vital signs in last 24 hours: Temp:  [97.8 F (36.6 C)-98.5 F (36.9 C)] 98.4 F (36.9 C) (01/02 0515) Pulse Rate:  [95-103] 95 (01/02 0515) Resp:  [16-18] 18 (01/02 0515) BP: (108-123)/(81-83) 108/83 (01/02 0515) SpO2:  [97 %-100 %] 97 % (01/02 0515) Weight change:  Last BM Date: 04/21/16  Intake/Output from previous day: 01/01 0701 - 01/02 0700 In: 2455 [P.O.:1380; I.V.:1025; IV Piggyback:50] Out: 50 [Drains:50] Intake/Output this shift: Total I/O In: 460 [P.O.:460] Out: -   Labs:  Recent Labs  04/20/16 0542  WBC 13.1*  HGB 12.7  HCT 35.9*  PLT 284   No results for input(s): NA, K, CL, CO2, GLUCOSE, BUN, CALCIUM in the last 72 hours.  Invalid input(s): CREATININR  Studies/Results: No results found.   PHYSICAL EXAM: Inc stable - no swelling or erythema JP stable at 50cc  Assessment/Plan: No change Plan d/c 1/3 with JP drain in place Cont IVF and liquid diet    Annette Roberson 04/22/2016, 12:32 PM

## 2016-04-22 NOTE — Op Note (Signed)
NAME:  DEENNA, BRUNEAU                   ACCOUNT NO.:  MEDICAL RECORD NO.:  [id number]  LOCATION:                                 FACILITY:  PHYSICIAN:  Deaire Mcwhirter L. Wilburn Cornelia, M.D.DATE OF BIRTH:  17-Dec-1970  DATE OF PROCEDURE:  04/19/2016 DATE OF DISCHARGE:                              OPERATIVE REPORT   PREOPERATIVE DIAGNOSES: 1. Anterior neck abscess/fluid collection. 2. Chyle fistula. 3. Status post total thyroidectomy and left neck dissection (April 10, 2016). 4. History of metastatic papillary thyroid cancer.  POSTOPERATIVE DIAGNOSES: 1. Anterior neck abscess/fluid collection. 2. Chyle fistula. 3. Status post total thyroidectomy and left neck dissection (April 10, 2016). 4. History of metastatic papillary thyroid cancer.  INDICATION: 1. Anterior neck abscess/fluid collection. 2. Chyle fistula. 3. Status post total thyroidectomy and left neck dissection (April 10, 2016). 4. History of metastatic papillary thyroid cancer.  SURGICAL PROCEDURE: 1. Exploration and drainage of postoperative neck fluid     collection/abscess. 2. Ligation, left chyle fistula.  ANESTHESIA:  General endotracheal.  SURGEON:  Early Chars. Wilburn Cornelia, MD.  COMPLICATIONS:  None.  ESTIMATED BLOOD LOSS:  Minimal. The patient transferred from the operating room to recovery room in stable condition.  BRIEF HISTORY:  The patient is a 46 year old female with a history of papillary thyroid carcinoma with metastatic disease in the left neck. The patient was evaluated by Dr. Constance Holster and underwent total thyroidectomy and left neck dissection involving zones 3, 4, and 5 for removal of bulky metastatic thyroid cancer.  The patient's surgery was uneventful and she was transferred to the floor.  Postoperatively, she had a small amount of leakage through her drain site and she was discharged with a drain in place.  Several days postoperatively, the drain was removed. Over several days prior  to her current admission, she began to develop increasing swelling and erythema of her anterior neck incision and previous thyroid surgical bed with increasing symptoms of pressure and discomfort.  She was admitted to the hospital on April 18, 2016, for intravenous antibiotics and CT scan of the neck.  CT scan showed extensive soft tissue erythema and fluid collection in the surgical site including the anterior compartment of the neck and the left lateral neck dissection site.  Given the patient's history and findings with concerns raised during her prior hospitalization for possible chyle leak, I recommended that we undertake exploration of her neck, drainage of the fluid/abscess and exploration for possible ongoing chyle leak and ligation of the area.  Risks and benefits of the procedure were discussed in detail with the patient and her husband. They understood and agreed with our plan for surgery which was scheduled on elective basis on April 19, 2016 at [facility].  DESCRIPTION OF PROCEDURE:  The patient was brought to the operating room, placed in supine position on the operating table.  General endotracheal anesthesia was established without difficulty.  When the patient was adequately anesthetized, she was positioned on the operating table and prepped and draped in a sterile fashion.  A surgical time-out was then performed with correct identification of the  patient and surgical procedure.  Her previous healing anterior cervical incision was injected with 1% lidocaine, 1:200,000 epinephrine.  A total of 3 mL was injected in a subcutaneous fashion.  The patient was then prepped, draped, and prepared for surgery.  With the patient prepared for surgery, previous incision was opened using forceps and scissors.  The previously placed Dermabond surgical glue was removed in its entirety and the subcutaneous sutures and subplatysmal flaps were divided and then  elevated to open the entire surgical field.  The wound was filled with a thin milky serous fluid. Cultures were obtained for possible infection and sent for culture and sensitivity.  A Mahorner Thyroid retractor was then placed in a subplatysmal flap fashion to allow access to the anterior compartment of the neck.  The wound was then thoroughly irrigated with copious volumes of sterile saline and all abnormal-appearing fluid was suctioned.  The tissue was very inflamed and friable consistent with possible infection versus possible chyle leak.  The left sternocleidomastoid muscle was then carefully elevated and the jugular vein was identified.  This was followed inferiorly to its entrance into the superior mediastinum. Multiple Valsalva maneuvers were performed to increase intrathoracic pressure and look for drainage.  There was a small amount of milky fluid in the inferior aspect of the dissection consistent with possible chyle leak.  Using a 4-0 chromic suture on a tapered needle, multiple horizontal mattressing sutures were placed in this area to attempt to ligate the thoracic duct and its drainage pathway into the jugular vein on the left.  Valsalva was repeated and there was no apparent leak.  The area was copiously irrigated again and reinspected and again no further leak was noted.  Surgicel was then placed overlying this area and a sternocleidomastoid muscle flap was elevated.  The anterior and medial border of the sternocleidomastoid muscle was elevated using Bovie electrocautery and a 2 x 4 cm muscle flap was rotated over the position of the inferior dissection where the chyle leak was identified.  This was sutured in position with multiple interrupted 3-0 chromic sutures. A 7 mm Terrial Rhodes drain was then placed through the previous drain site and brought into the depth of the wound.  The patient's incision was then closed in multiple layers beginning with reapproximation of  the platysmal layer with interrupted 3-0 Vicryl suture, followed by immediate subcutaneous closure with interrupted 4-0 Vicryl suture and final skin closure with Dermabond surgical glue.  The patient was then awakened from anesthetic.  She was extubated and was transferred from the operating room to recovery room in stable condition.  There were no complications.  Estimated blood loss was minimal.          ______________________________ Early Chars. Wilburn Cornelia, M.D.     DLS/MEDQ  D:  [id number]  T:  04/20/2016  Job:  [id number]

## 2016-04-23 MED ORDER — AMOXICILLIN-POT CLAVULANATE 500-125 MG PO TABS: 1.0000 | ORAL_TABLET | Freq: Two times a day (BID) | ORAL | 0 refills | Status: DC

## 2016-04-23 NOTE — Discharge Summary (Signed)
Physician Discharge Summary  Patient ID: Annette Roberson MRN: [id number] DOB/AGE: 1970-10-30 46 y.o.  Admit date: 04/18/2016 Discharge date: 04/23/2016  Admission Diagnoses:  Principal Problem:   Cellulitis Active Problems:   Neck abscess   Discharge Diagnoses:  Same  Surgeries: Procedure(s): INCISION AND DRAINAGE ABSCESS WOUND EXPLORATION on 04/18/2016 - 04/19/2016   Consultants: None  Discharged Condition: Improved  Hospital Course: Annette Roberson is an 46 y.o. female who was admitted 04/18/2016 with a diagnosis of Cellulitis and went to the operating room on 04/18/2016 - 04/19/2016 and underwent the above named procedures.   Patient stable and d/c'ed on postop day #4.  Recent vital signs:  Vitals:   04/23/16 0150 04/23/16 0553  BP: 124/83 119/86  Pulse:    Resp: 18 18  Temp: 98.4 F (36.9 C) 98.4 F (36.9 C)    Recent laboratory studies:  Results for orders placed or performed during the hospital encounter of 04/18/16  Aerobic/Anaerobic Culture (surgical/deep wound)  Result Value Ref Range   Specimen Description ABSCESS NECK    Special Requests NONE    Gram Stain NO WBC SEEN FEW GRAM POSITIVE COCCI IN PAIRS     Culture (A)     STAPHYLOCOCCUS AUREUS NO ANAEROBES ISOLATED; CULTURE IN PROGRESS FOR 5 DAYS    Report Status PENDING    Organism ID, Bacteria STAPHYLOCOCCUS AUREUS       Susceptibility   Staphylococcus aureus - MIC*    CIPROFLOXACIN <=0.5 SENSITIVE Sensitive     ERYTHROMYCIN >=8 RESISTANT Resistant     GENTAMICIN <=0.5 SENSITIVE Sensitive     OXACILLIN 0.5 SENSITIVE Sensitive     TETRACYCLINE >=16 RESISTANT Resistant     VANCOMYCIN <=0.5 SENSITIVE Sensitive     TRIMETH/SULFA <=10 SENSITIVE Sensitive     CLINDAMYCIN <=0.25 SENSITIVE Sensitive     RIFAMPIN <=0.5 SENSITIVE Sensitive     Inducible Clindamycin NEGATIVE Sensitive     * STAPHYLOCOCCUS AUREUS  Basic metabolic panel  Result Value Ref Range   Sodium 135 135 - 145 mmol/L   Potassium 3.7 3.5 - 5.1 mmol/L   Chloride 105 101 - 111 mmol/L   CO2 21 (L) 22 - 32 mmol/L   Glucose, Bld 180 (H) 65 - 99 mg/dL   BUN 11 6 - 20 mg/dL   Creatinine, Ser 0.99 0.44 - 1.00 mg/dL   Calcium 7.2 (L) 8.9 - 10.3 mg/dL   GFR calc non Af Amer >60 >60 mL/min   GFR calc Af Amer >60 >60 mL/min   Anion gap 9 5 - 15  CBC  Result Value Ref Range   WBC 17.0 (H) 4.0 - 10.5 K/uL   RBC 4.36 3.87 - 5.11 MIL/uL   Hemoglobin 13.6 12.0 - 15.0 g/dL   HCT 38.7 36.0 - 46.0 %   MCV 88.8 78.0 - 100.0 fL   MCH 31.2 26.0 - 34.0 pg   MCHC 35.1 30.0 - 36.0 g/dL   RDW 13.8 11.5 - 15.5 %   Platelets 252 150 - 400 K/uL  CBC with Differential/Platelet  Result Value Ref Range   WBC 13.1 (H) 4.0 - 10.5 K/uL   RBC 4.08 3.87 - 5.11 MIL/uL   Hemoglobin 12.7 12.0 - 15.0 g/dL   HCT 35.9 (L) 36.0 - 46.0 %   MCV 88.0 78.0 - 100.0 fL   MCH 31.1 26.0 - 34.0 pg   MCHC 35.4 30.0 - 36.0 g/dL   RDW 14.0 11.5 - 15.5 %   Platelets 284 150 - 400  K/uL   Neutrophils Relative % 73 %   Neutro Abs 9.6 (H) 1.7 - 7.7 K/uL   Lymphocytes Relative 15 %   Lymphs Abs 2.0 0.7 - 4.0 K/uL   Monocytes Relative 9 %   Monocytes Absolute 1.2 (H) 0.1 - 1.0 K/uL   Eosinophils Relative 3 %   Eosinophils Absolute 0.4 0.0 - 0.7 K/uL   Basophils Relative 0 %   Basophils Absolute 0.0 0.0 - 0.1 K/uL    Discharge Medications:   Allergies as of 04/23/2016   No Known Allergies     Medication List    TAKE these medications   amitriptyline 50 MG tablet Commonly known as:  ELAVIL TAKE 1 TABLET AT BEDTIME What changed:  See the new instructions.   amoxicillin-clavulanate 500-125 MG tablet Commonly known as:  AUGMENTIN Take 1 tablet (500 mg total) by mouth 2 (two) times daily.   calcium carbonate 1250 (500 Ca) MG tablet Commonly known as:  OS-CAL - dosed in mg of elemental calcium Take 1 tablet (500 mg of elemental calcium total) by mouth 3 (three) times daily with meals.   HYDROcodone-acetaminophen 7.5-325 MG tablet Commonly  known as:  NORCO Take 1 tablet by mouth every 6 (six) hours as needed for moderate pain.   levothyroxine 100 MCG tablet Commonly known as:  SYNTHROID Take 1 tablet (100 mcg total) by mouth daily.   levothyroxine 100 MCG tablet Commonly known as:  SYNTHROID Take 1 tablet (100 mcg total) by mouth daily.   norethindrone-ethinyl estradiol 1-20 MG-MCG tablet Commonly known as:  MICROGESTIN,JUNEL,LOESTRIN Take 1 tablet by mouth daily.   RELPAX 40 MG tablet Generic drug:  eletriptan TAKE 1 TABLET AS NEEDED FOR HEADACHE, MAY REPEAT ONCE AFTER 30 MINUTES, MAXIMUM 2 TABLETS PER 24 HOURS, MAXIMUM 8 PER MONTH   topiramate 100 MG tablet Commonly known as:  TOPAMAX TAKE 1 TABLET TWICE A DAY (NEED APPOINTMENT) What changed:  See the new instructions.   Vitamin D3 1000 units Caps Take 1,000 Units by mouth at bedtime.       Diagnostic Studies: Dg Chest 2 View  Result Date: 04/04/2016 CLINICAL DATA:  Preop testing EXAM: CHEST  2 VIEW COMPARISON:  None. FINDINGS: The heart size and mediastinal contours are within normal limits. Both lungs are clear. The visualized skeletal structures are unremarkable. IMPRESSION: No active cardiopulmonary disease. Electronically Signed   By: Rolm Baptise M.D.   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 Neck abscess. EXAM: CT NECK WITH CONTRAST TECHNIQUE: Multidetector CT imaging of the neck was performed using the standard protocol following the bolus administration of intravenous contrast. CONTRAST:  75 cc Isovue-300 COMPARISON:  CT 12/28/2015.  Ultrasound 03/06/2016. FINDINGS: The patient has had thyroidectomy. At the level of the larynx and above, the neck appears normal. Below that, there is abnormal low-density fluid extending throughout the tissue planes of the lower neck, more extensively on the left than the right, but with some  extension on the right as well. This extends into the superior mediastinum as well. By clinical history, this is abscess. Fluid surrounds the sternocleidomastoid muscle on the left, the strap muscles bilaterally, and partially extends around the sternocleidomastoid muscle on the right. This fluid surrounds the left carotid artery and the left internal jugular vein. The lower aspect of the internal jugular vein is narrowed but probably does remain patent to join the subclavian  vein and form the innominate vein. Fluid on the right side of the neck does extend adjacent to the common carotid artery and jugular vein but does not completely surround them as is the case on the left. IMPRESSION: Previous thyroidectomy. Low-density fluid within the anterior neck more extensively on the left than the right consistent with abscess. This process extends from the level of the larynx down into the superior mediastinum. Low-density fluid surrounds the left sternocleidomastoid muscle and the strap muscles and partially surrounds the right sternocleidomastoid muscle. This fluid surrounds the left common carotid artery and the left internal jugular vein and markedly narrows the lower aspect of the internal jugular vein. This fluid does contact the vessels in the right neck but does not completely surround them. Electronically Signed   By: Nelson Chimes M.D.   On: 04/18/2016 21:25    Disposition: 01-Home or Self Care  Discharge Instructions    Diet - low sodium heart healthy    Complete by:  As directed    Discharge instructions    Complete by:  As directed    Sinus Instructions: 1. Limited activity 2. Liquid diet 3. May bathe and shower, keep drain dry 4. Monitor JP drain output and record 5. Elevate Head of Bed   Increase activity slowly    Complete by:  As directed       Follow-up Information    ROSEN, JEFRY, MD. Schedule an appointment as soon as possible for a visit.   Specialty:  Otolaryngology Contact  information: [address] [place] [address] [place] [address] [phone]        Izora Gala, MD .   Specialty:  Otolaryngology Contact information: [address] [place] [address]            Signed: Jerrell Belfast 04/23/2016, 10:22 AM

## 2016-04-23 NOTE — Progress Notes (Signed)
   ENT Progress Note: POD #4 s/p Procedure(s): INCISION AND DRAINAGE ABSCESS WOUND EXPLORATION   Subjective: No complaints  Objective: Vital signs in last 24 hours: Temp:  [98.1 F (36.7 C)-98.8 F (37.1 C)] 98.4 F (36.9 C) (01/03 0553) Pulse Rate:  [89-95] 89 (01/02 2215) Resp:  [17-18] 18 (01/03 0553) BP: (113-129)/(79-86) 119/86 (01/03 0553) SpO2:  [98 %-100 %] 99 % (01/03 0150) Weight change:  Last BM Date: 04/21/16  Intake/Output from previous day: 01/02 0701 - 01/03 0700 In: 2611.7 [P.O.:1360; I.V.:1051.7; IV Piggyback:200] Out: 57.5 [Drains:57.5] Intake/Output this shift: Total I/O In: 340 [P.O.:340] Out: -   Labs: No results for input(s): WBC, HGB, HCT, PLT in the last 72 hours. No results for input(s): NA, K, CL, CO2, GLUCOSE, BUN, CALCIUM in the last 72 hours.  Invalid input(s): CREATININR  Studies/Results: No results found.   PHYSICAL EXAM: Inc stable, no swelling, min erythema JP 10cc   Assessment/Plan: Pt stable D/C to home Cont liquid diet and monitor JP    Dylon Correa 04/23/2016, 10:14 AM

## 2016-04-23 NOTE — Progress Notes (Signed)
Pt discharged home with family in stable condition after going over discharge instructions with no concerns voiced. AVS and discharge meds script given before leaving unit

## 2016-04-24 LAB — AEROBIC/ANAEROBIC CULTURE W GRAM STAIN (SURGICAL/DEEP WOUND): Gram Stain: NONE SEEN

## 2016-04-24 LAB — AEROBIC/ANAEROBIC CULTURE (SURGICAL/DEEP WOUND)

## 2016-06-03 ENCOUNTER — Other Ambulatory Visit (HOSPITAL_COMMUNITY): Payer: Self-pay | Admitting: Endocrinology

## 2016-06-03 DIAGNOSIS — C73 Malignant neoplasm of thyroid gland: Secondary | ICD-10-CM

## 2016-06-16 ENCOUNTER — Encounter (HOSPITAL_COMMUNITY)
Admission: RE | Admit: 2016-06-16 | Discharge: 2016-06-16 | Disposition: A | Discharge status: Home / Self Care | Payer: 59 | Source: Ambulatory Visit | Attending: Endocrinology | Admitting: Endocrinology

## 2016-06-16 DIAGNOSIS — C73 Malignant neoplasm of thyroid gland: Secondary | ICD-10-CM | POA: Insufficient documentation

## 2016-06-16 MED ORDER — THYROTROPIN ALFA 1.1 MG IM SOLR
0.9000 mg | INTRAMUSCULAR | Status: AC
  Administered 2016-06-16: 0.9 mg via INTRAMUSCULAR

## 2016-06-17 ENCOUNTER — Encounter (HOSPITAL_COMMUNITY)
Admission: RE | Admit: 2016-06-17 | Discharge: 2016-06-17 | Disposition: A | Discharge status: Home / Self Care | Payer: 59 | Source: Ambulatory Visit | Attending: Endocrinology | Admitting: Endocrinology

## 2016-06-17 DIAGNOSIS — C73 Malignant neoplasm of thyroid gland: Secondary | ICD-10-CM | POA: Diagnosis not present

## 2016-06-17 MED ORDER — THYROTROPIN ALFA 1.1 MG IM SOLR
0.9000 mg | INTRAMUSCULAR | Status: AC
  Administered 2016-06-17: 0.9 mg via INTRAMUSCULAR

## 2016-06-18 ENCOUNTER — Encounter (HOSPITAL_COMMUNITY)
Admission: RE | Admit: 2016-06-18 | Discharge: 2016-06-18 | Disposition: A | Discharge status: Home / Self Care | Payer: 59 | Source: Ambulatory Visit | Attending: Endocrinology | Admitting: Endocrinology

## 2016-06-18 DIAGNOSIS — C73 Malignant neoplasm of thyroid gland: Secondary | ICD-10-CM | POA: Diagnosis not present

## 2016-06-18 LAB — HCG, SERUM, QUALITATIVE: PREG SERUM: NEGATIVE

## 2016-06-18 MED ORDER — SODIUM IODIDE I 131 CAPSULE
150.0000 | Freq: Once | INTRAVENOUS | Status: AC | PRN
  Administered 2016-06-18: 150 via ORAL

## 2016-06-25 ENCOUNTER — Encounter (HOSPITAL_COMMUNITY)
Admission: RE | Admit: 2016-06-25 | Discharge: 2016-06-25 | Disposition: A | Discharge status: Home / Self Care | Payer: 59 | Source: Ambulatory Visit | Attending: Endocrinology | Admitting: Endocrinology

## 2016-06-25 DIAGNOSIS — C73 Malignant neoplasm of thyroid gland: Secondary | ICD-10-CM | POA: Insufficient documentation

## 2017-03-25 ENCOUNTER — Other Ambulatory Visit (HOSPITAL_COMMUNITY): Payer: Self-pay | Admitting: Endocrinology

## 2017-03-25 DIAGNOSIS — C73 Malignant neoplasm of thyroid gland: Secondary | ICD-10-CM

## 2017-03-30 ENCOUNTER — Encounter (HOSPITAL_COMMUNITY)
Admission: RE | Admit: 2017-03-30 | Discharge: 2017-03-30 | Disposition: A | Discharge status: Home / Self Care | Payer: 59 | Source: Ambulatory Visit | Attending: Endocrinology | Admitting: Endocrinology

## 2017-03-30 DIAGNOSIS — C73 Malignant neoplasm of thyroid gland: Secondary | ICD-10-CM | POA: Diagnosis not present

## 2017-03-30 MED ORDER — THYROTROPIN ALFA 1.1 MG IM SOLR
0.9000 mg | INTRAMUSCULAR | Status: AC
Start: 2017-03-30 — End: 2017-03-30
  Administered 2017-03-30: 0.9 mg via INTRAMUSCULAR

## 2017-03-30 MED ORDER — STERILE WATER FOR INJECTION IJ SOLN
INTRAMUSCULAR | Status: AC
  Administered 2017-03-30: 1.2 mL
  Filled 2017-03-30: qty 10

## 2017-03-31 ENCOUNTER — Encounter (HOSPITAL_COMMUNITY)
Admission: RE | Admit: 2017-03-31 | Discharge: 2017-03-31 | Disposition: A | Discharge status: Home / Self Care | Payer: 59 | Source: Ambulatory Visit | Attending: Endocrinology | Admitting: Endocrinology

## 2017-03-31 DIAGNOSIS — C73 Malignant neoplasm of thyroid gland: Secondary | ICD-10-CM | POA: Insufficient documentation

## 2017-03-31 MED ORDER — STERILE WATER FOR INJECTION IJ SOLN
INTRAMUSCULAR | Status: AC
  Filled 2017-03-31: qty 10

## 2017-03-31 MED ORDER — THYROTROPIN ALFA 1.1 MG IM SOLR
0.9000 mg | INTRAMUSCULAR | Status: AC
  Administered 2017-03-31: 0.9 mg via INTRAMUSCULAR

## 2017-04-01 ENCOUNTER — Encounter (HOSPITAL_COMMUNITY)
Admission: RE | Admit: 2017-04-01 | Discharge: 2017-04-01 | Disposition: A | Discharge status: Home / Self Care | Payer: 59 | Source: Ambulatory Visit | Attending: Endocrinology | Admitting: Endocrinology

## 2017-04-01 DIAGNOSIS — C73 Malignant neoplasm of thyroid gland: Secondary | ICD-10-CM | POA: Diagnosis present

## 2017-04-01 LAB — HCG, SERUM, QUALITATIVE: PREG SERUM: NEGATIVE

## 2017-04-01 MED ORDER — SODIUM IODIDE I 131 CAPSULE
4.0000 | Freq: Once | INTRAVENOUS | Status: AC | PRN
  Administered 2017-04-01: 4 via ORAL

## 2017-04-03 ENCOUNTER — Encounter (HOSPITAL_COMMUNITY)
Admission: RE | Admit: 2017-04-03 | Discharge: 2017-04-03 | Disposition: A | Discharge status: Home / Self Care | Payer: 59 | Source: Ambulatory Visit | Attending: Endocrinology | Admitting: Endocrinology

## 2017-04-03 MED ORDER — SODIUM IODIDE I 131 CAPSULE: 4.0000 | Freq: Once | INTRAVENOUS | Status: DC | PRN

## 2017-05-03 IMAGING — US US THYROID
1 series · 1 of 1 positions shown · non-contrast
Comparison: Thyroid ultrasound - 02/01/2016

MEDICATIONS:
None

COMPLICATIONS:
None immediate.

INDICATION: Indeterminate thyroid nodule

EXAM:
ULTRASOUND GUIDED FINE NEEDLE ASPIRATION OF INDETERMINATE THYROID
NODULE
TECHNIQUE: Informed written consent was obtained from the patient after a
discussion of the risks, benefits and alternatives to treatment.
Questions regarding the procedure were encouraged and answered. A
timeout was performed prior to the initiation of the procedure.

[Series 1: us thyroid · 1 of 91 frames shown]
[frame 46/91]
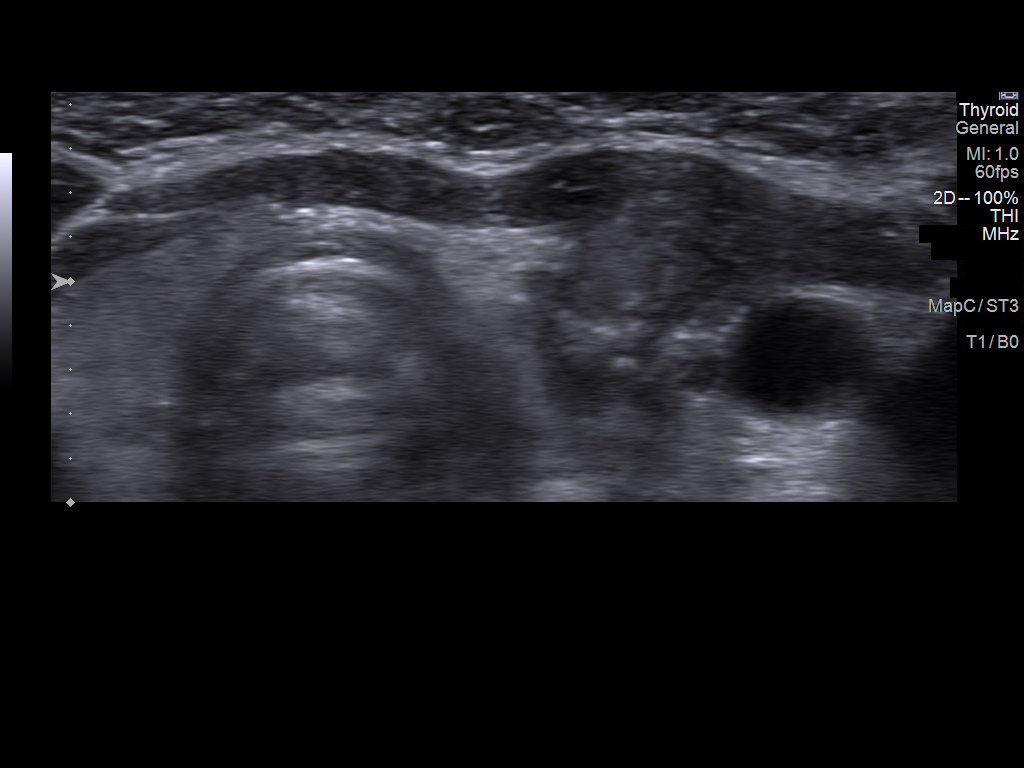

[1 of 1 positions shown; findings below may reference images not displayed]

Pre-procedural ultrasound scanning demonstrated unchanged size and
appearance of the indeterminate nodule within the inferior medial
aspect the left lobe of the thyroid.

The procedure was planned. The neck was prepped in the usual sterile
fashion, and a sterile drape was applied covering the operative
field. A timeout was performed prior to the initiation of the
procedure. Local anesthesia was provided with 1% lidocaine.

Under direct ultrasound guidance, 4 FNA biopsies were performed of
the indeterminate nodule within the inferior medial aspect the left
lobe of the thyroid with a 25 gauge needle. Multiple ultrasound
images were saved for procedural documentation purposes. The samples
were prepared and submitted to pathology.

Limited post procedural scanning was negative for hematoma or
additional complication. Dressings were placed. The patient
tolerated the above procedures procedure well without immediate
postprocedural complication.
FINDINGS: FINDINGS
Nodule reference number based on prior diagnostic ultrasound: 1

Maximum size: 1.1 cm

Location: Left  ;  Inferior

ACR TI-RADS total points: 7

ACR TI-RADS risk category:  TR5 (>/= 7 points)

Prior biopsy:  No

Reason for biopsy: meets ACR TI-RADS criteria

Ultrasound imaging confirms appropriate placement of the needles
within the thyroid nodule.
IMPRESSION: Technically successful ultrasound guided fine needle aspiration of
highly suspicious nodule within the inferior medial aspect the left
lobe of the thyroid.

## 2018-03-23 ENCOUNTER — Other Ambulatory Visit (HOSPITAL_COMMUNITY): Payer: Self-pay | Admitting: Endocrinology

## 2018-03-23 ENCOUNTER — Other Ambulatory Visit (HOSPITAL_COMMUNITY): Payer: Self-pay | Admitting: Wound Care

## 2018-03-23 DIAGNOSIS — C73 Malignant neoplasm of thyroid gland: Secondary | ICD-10-CM

## 2018-03-29 ENCOUNTER — Encounter (HOSPITAL_COMMUNITY)
Admission: RE | Admit: 2018-03-29 | Discharge: 2018-03-29 | Disposition: A | Discharge status: Home / Self Care | Payer: 59 | Source: Ambulatory Visit | Attending: Endocrinology | Admitting: Endocrinology

## 2018-03-29 DIAGNOSIS — C73 Malignant neoplasm of thyroid gland: Secondary | ICD-10-CM | POA: Diagnosis present

## 2018-03-29 MED ORDER — STERILE WATER FOR INJECTION IJ SOLN
INTRAMUSCULAR | Status: AC
  Filled 2018-03-29: qty 10

## 2018-03-29 MED ORDER — THYROTROPIN ALFA 1.1 MG IM SOLR
0.9000 mg | INTRAMUSCULAR | Status: AC
  Administered 2018-03-29: 0.9 mg via INTRAMUSCULAR

## 2018-03-30 ENCOUNTER — Encounter (HOSPITAL_COMMUNITY)
Admission: RE | Admit: 2018-03-30 | Discharge: 2018-03-30 | Disposition: A | Discharge status: Home / Self Care | Payer: 59 | Source: Ambulatory Visit | Attending: Endocrinology | Admitting: Endocrinology

## 2018-03-30 DIAGNOSIS — C73 Malignant neoplasm of thyroid gland: Secondary | ICD-10-CM | POA: Diagnosis not present

## 2018-03-30 IMAGING — US US BIOPSY
1 series · 12 of 19 positions shown · non-contrast
Comparison: Thyroid ultrasound - 02/01/2016;

INDICATION: Indeterminate partially cystic, partially solid mass within the
inferior aspect the left side of the neck

[Series 1: us biopsy · 0.09mm/px · 19 acquisitions, 12 frames shown]
[im 1/19]
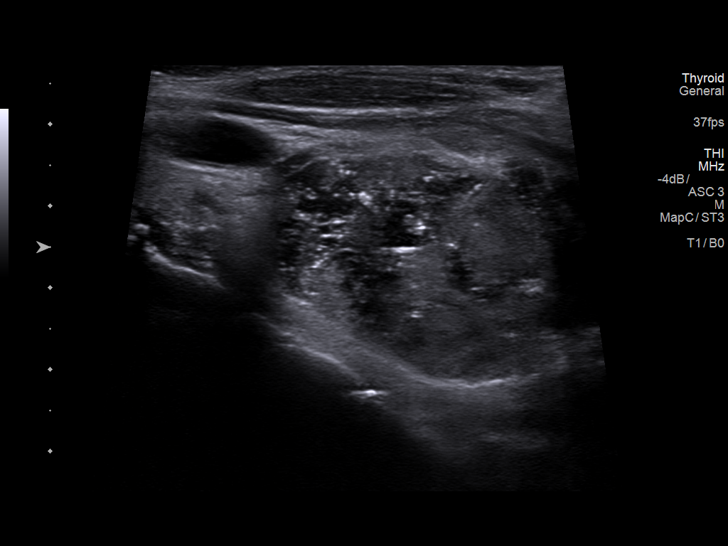
[im 3/19]
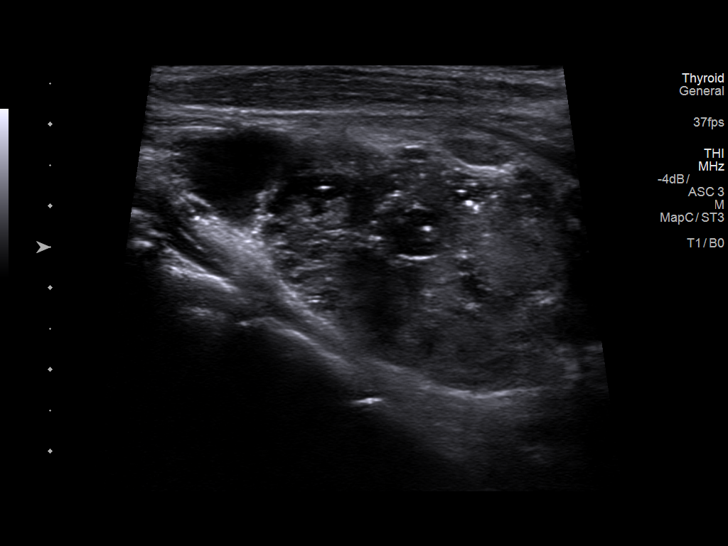
[im 4/19]
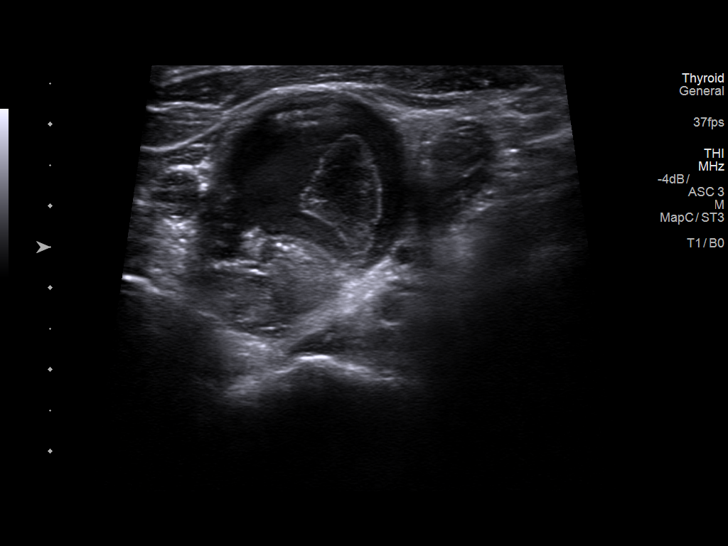
[im 6/19]
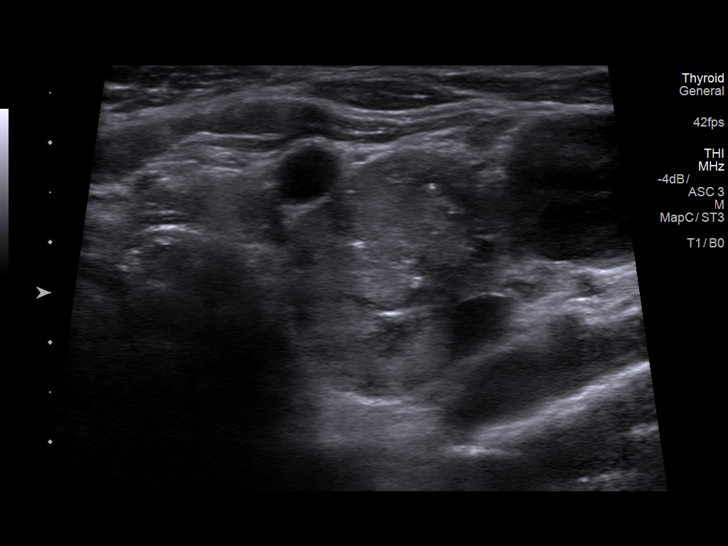
[im 8/19]
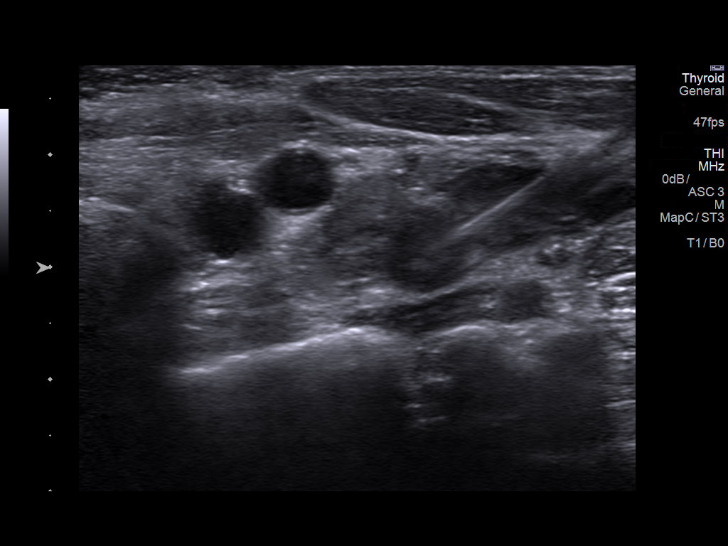
[im 9/19]
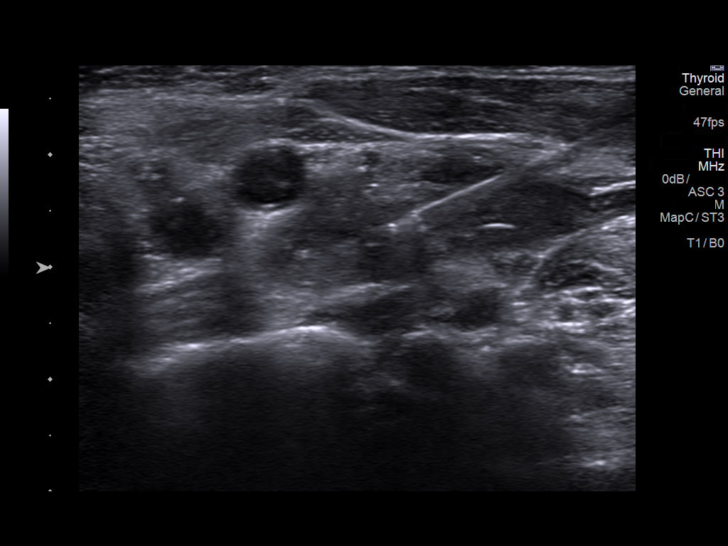
[im 11/19]
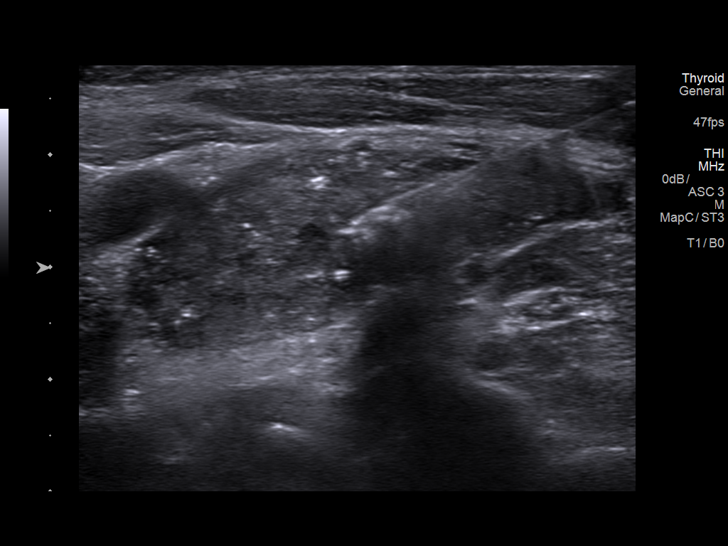
[im 12/19]
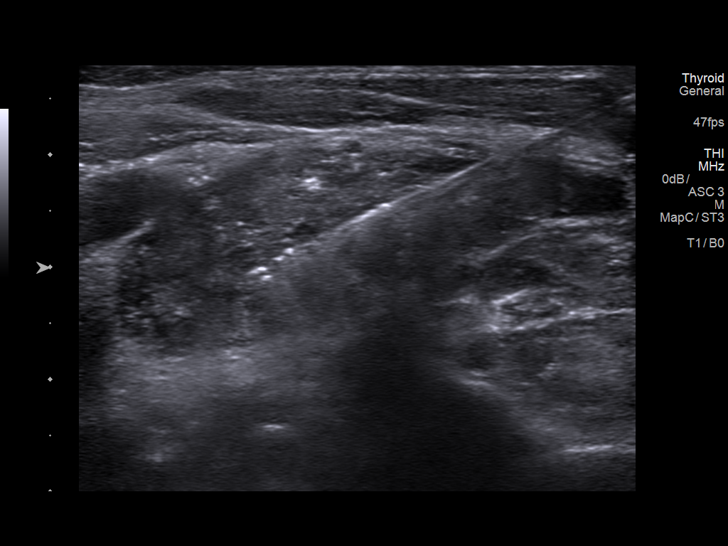
[im 14/19]
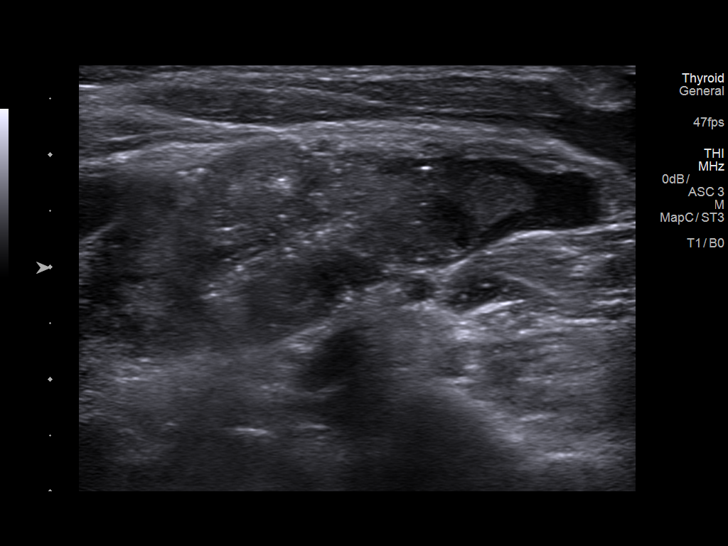
[im 16/19]
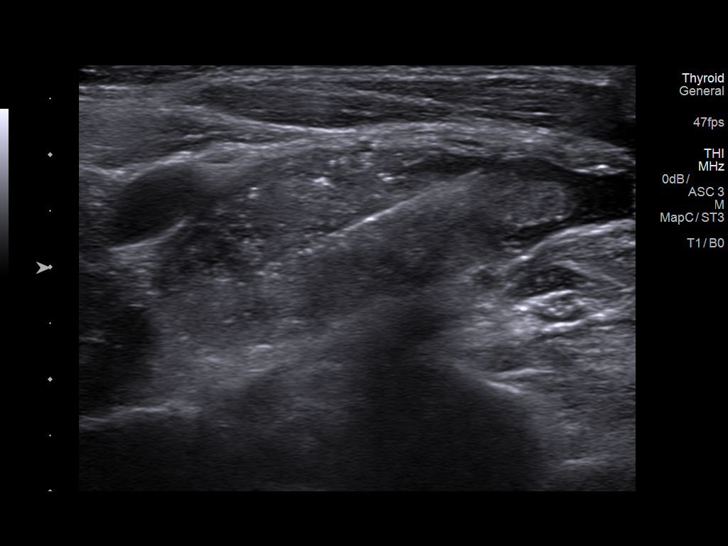
[im 17/19]
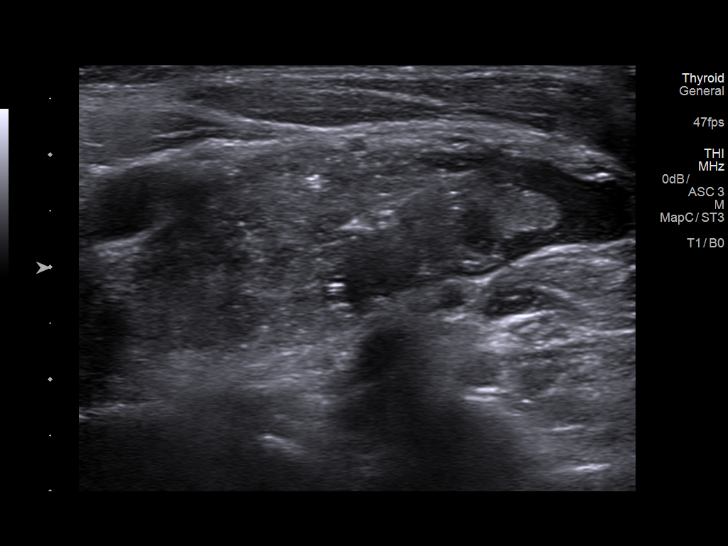
[im 19/19]
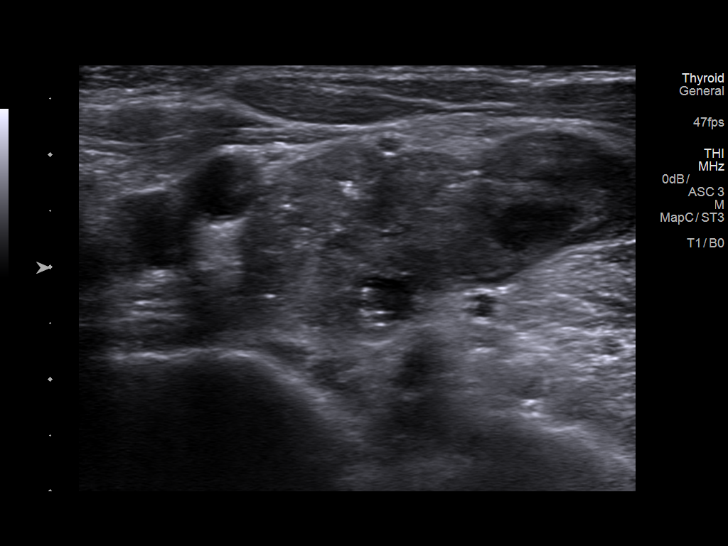

[12 of 19 positions shown; findings below may reference images not displayed]

Patient previously underwent ultrasound-guided aspiration of the
dominant cystic component however this only yielded contents
compatible with a simple cyst.

As such, request made for ultrasound-guided repeat aspiration of the
cystic component as well as fine-needle aspiration and core needle
biopsy of the solid component of this complex left-sided neck mass.

EXAM:
1. ULTRASOUND-GUIDED ASPIRATION OF CYSTIC COMPONENT OF COMPLEX
PARTIALLY CYSTIC, PARTIALLY SOLID MASS WITHIN THE INFERIOR ASPECT OF
THE LEFT SIDE OF THE NECK.
2. ULTRASOUND GUIDED FINE-NEEDLE ASPIRATION AND CORE NEEDLE BIOPSY
OF THE SOLID COMPONENT OF THE PARTIALLY CYSTIC, PARTIALLY SOLID MASS
WITHIN THE INFERIOR ASPECT THE LEFT SIDE OF THE NECK
ultrasound-guided left
cyst aspiration - 02/15/2016; contrast-enhanced neck CT - 12/28/2015
([facility] [REDACTED] [HOSPITAL] examination)

MEDICATIONS:
None.

ANESTHESIA/SEDATION:
None

PROCEDURE:
The procedure, risks, benefits, and alternatives were explained to
the patient. Questions regarding the procedure were encouraged and
answered. The patient understands and consents to the procedure.

The skin overlying the inferior left side of the neck was prepped
with chlorhexidine in a sterile fashion, and a sterile drape was
applied covering the operative field. A sterile gown and sterile
gloves were used for the procedure. Local anesthesia was provided
with 1% Lidocaine with epinephrine.

Under direct ultrasound guidance, an 18 gauge trocar needle was
advanced into the dominant cystic component of the complex mass
within the inferior aspect the left side of the neck. Multiple
ultrasound images were saved for procedural documentation purposes.

Initially, approximately 10 cc of dark black fluid was aspirated
from the dominant cystic component. All aspirated fluid was capped
and sent to the laboratory for cytologic analysis.

Next, the coaxial needle was advanced into the more solid component
of the complex left-sided neck mass. Under direct ultrasound
guidance, 3 fine-needle aspirations were obtained with a 20 gauge
Kurt Richards needle. Samples were prepared and submitted to pathology.

Next, 4 core needle biopsies were obtained with an 18 gauge core
needle biopsy. Multiple ultrasound images were saved for procedural
documentation purposes. Samples were placed in saline and submitted
to pathology.

Postprocedural imaging was negative for complication. Specifically,
no evidence of hematoma. Dressings were placed. The patient
tolerated the procedure well without immediate postprocedural
complication.

COMPLICATIONS:
None immediate.
FINDINGS: Grossly unchanged complex partially cystic, partially solid mass
within the inferior aspect of the left side of the neck.

Under direct ultrasound guidance, approximately 10 cc of dark black
fluid was aspirated from the dominant cystic component.

Under direct ultrasound guidance, fine-needle aspirations and core
needle biopsies were obtained of the solid component of the complex
mass.
IMPRESSION: 1. Technically successful ultrasound-guided aspiration of dominant
cystic component of the complex mass within the inferior aspect of
the left side of the neck.
2. Technically successful ultrasound-guided fine-needle aspiration
and core needle biopsy of the solid component of the complex mass
within the inferior aspect of the left side of the neck.

## 2018-03-30 MED ORDER — STERILE WATER FOR INJECTION IJ SOLN
INTRAMUSCULAR | Status: DC
Start: 2018-03-30 — End: 2018-03-30
  Filled 2018-03-30: qty 10

## 2018-03-30 MED ORDER — THYROTROPIN ALFA 1.1 MG IM SOLR
0.9000 mg | INTRAMUSCULAR | Status: AC
  Administered 2018-03-30: 0.9 mg via INTRAMUSCULAR

## 2018-03-31 ENCOUNTER — Encounter (HOSPITAL_COMMUNITY)
Admission: RE | Admit: 2018-03-31 | Discharge: 2018-03-31 | Disposition: A | Discharge status: Home / Self Care | Payer: 59 | Source: Ambulatory Visit | Attending: Endocrinology | Admitting: Endocrinology

## 2018-03-31 DIAGNOSIS — C73 Malignant neoplasm of thyroid gland: Secondary | ICD-10-CM | POA: Diagnosis not present

## 2018-03-31 LAB — HCG, SERUM, QUALITATIVE: PREG SERUM: NEGATIVE

## 2018-03-31 MED ORDER — SODIUM IODIDE I 131 CAPSULE
4.0000 | Freq: Once | INTRAVENOUS | Status: AC | PRN
  Administered 2018-03-31: 4 via ORAL

## 2018-04-01 ENCOUNTER — Encounter (HOSPITAL_COMMUNITY): Payer: 59

## 2018-04-02 ENCOUNTER — Encounter (HOSPITAL_COMMUNITY)
Admission: RE | Admit: 2018-04-02 | Discharge: 2018-04-02 | Disposition: A | Discharge status: Home / Self Care | Payer: 59 | Source: Ambulatory Visit | Attending: Endocrinology | Admitting: Endocrinology

## 2018-04-02 DIAGNOSIS — C73 Malignant neoplasm of thyroid gland: Secondary | ICD-10-CM | POA: Diagnosis not present

## 2018-04-02 MED ORDER — SODIUM IODIDE I 131 CAPSULE
4.0000 | Freq: Once | INTRAVENOUS | Status: AC | PRN
  Administered 2018-04-02: 4 via ORAL

## 2018-06-18 ENCOUNTER — Encounter (HOSPITAL_COMMUNITY): Payer: Self-pay | Admitting: Emergency Medicine

## 2018-06-18 ENCOUNTER — Emergency Department (HOSPITAL_COMMUNITY)
Admission: EM | Admit: 2018-06-18 | Discharge: 2018-06-19 | Disposition: A | Discharge status: Home / Self Care | Payer: BLUE CROSS/BLUE SHIELD | Attending: Emergency Medicine | Admitting: Emergency Medicine

## 2018-06-18 DIAGNOSIS — Z85850 Personal history of malignant neoplasm of thyroid: Secondary | ICD-10-CM | POA: Insufficient documentation

## 2018-06-18 DIAGNOSIS — A666 Bone and joint lesions of yaws: Secondary | ICD-10-CM | POA: Diagnosis not present

## 2018-06-18 DIAGNOSIS — Z79899 Other long term (current) drug therapy: Secondary | ICD-10-CM | POA: Diagnosis not present

## 2018-06-18 DIAGNOSIS — R109 Unspecified abdominal pain: Secondary | ICD-10-CM | POA: Diagnosis not present

## 2018-06-18 DIAGNOSIS — N200 Calculus of kidney: Secondary | ICD-10-CM | POA: Diagnosis not present

## 2018-06-18 DIAGNOSIS — R9389 Abnormal findings on diagnostic imaging of other specified body structures: Secondary | ICD-10-CM | POA: Insufficient documentation

## 2018-06-18 DIAGNOSIS — M899 Disorder of bone, unspecified: Secondary | ICD-10-CM | POA: Diagnosis not present

## 2018-06-18 DIAGNOSIS — R1012 Left upper quadrant pain: Secondary | ICD-10-CM | POA: Diagnosis present

## 2018-06-18 DIAGNOSIS — R102 Pelvic and perineal pain: Secondary | ICD-10-CM | POA: Insufficient documentation

## 2018-06-18 LAB — URINALYSIS, ROUTINE W REFLEX MICROSCOPIC
BILIRUBIN URINE: NEGATIVE
GLUCOSE, UA: NEGATIVE mg/dL
Hgb urine dipstick: NEGATIVE
Ketones, ur: 5 mg/dL — AB
Leukocytes,Ua: NEGATIVE
Nitrite: NEGATIVE
PROTEIN: NEGATIVE mg/dL
Specific Gravity, Urine: 1.025 (ref 1.005–1.030)
pH: 6 (ref 5.0–8.0)

## 2018-06-18 LAB — CBC
HCT: 42.3 % (ref 36.0–46.0)
Hemoglobin: 13.9 g/dL (ref 12.0–15.0)
MCH: 30.6 pg (ref 26.0–34.0)
MCHC: 32.9 g/dL (ref 30.0–36.0)
MCV: 93.2 fL (ref 80.0–100.0)
PLATELETS: 208 10*3/uL (ref 150–400)
RBC: 4.54 MIL/uL (ref 3.87–5.11)
RDW: 12.6 % (ref 11.5–15.5)
WBC: 9.3 10*3/uL (ref 4.0–10.5)
nRBC: 0 % (ref 0.0–0.2)

## 2018-06-18 LAB — COMPREHENSIVE METABOLIC PANEL
ALK PHOS: 21 U/L — AB (ref 38–126)
ALT: 39 U/L (ref 0–44)
ANION GAP: 8 (ref 5–15)
AST: 28 U/L (ref 15–41)
Albumin: 3.8 g/dL (ref 3.5–5.0)
BILIRUBIN TOTAL: 0.3 mg/dL (ref 0.3–1.2)
BUN: 22 mg/dL — ABNORMAL HIGH (ref 6–20)
CALCIUM: 8.7 mg/dL — AB (ref 8.9–10.3)
CO2: 26 mmol/L (ref 22–32)
Chloride: 106 mmol/L (ref 98–111)
Creatinine, Ser: 0.89 mg/dL (ref 0.44–1.00)
GFR calc non Af Amer: >60 mL/min (ref >60)
Glucose, Bld: 157 mg/dL — ABNORMAL HIGH (ref 70–99)
Potassium: 3.9 mmol/L (ref 3.5–5.1)
Sodium: 140 mmol/L (ref 135–145)
TOTAL PROTEIN: 6.3 g/dL — AB (ref 6.5–8.1)

## 2018-06-18 LAB — LIPASE, BLOOD: Lipase: 56 U/L — ABNORMAL HIGH (ref 11–51)

## 2018-06-18 LAB — I-STAT BETA HCG BLOOD, ED (MC, WL, AP ONLY): I-stat hCG, quantitative: <5 m[IU]/mL (ref <5)

## 2018-06-18 MED ORDER — SODIUM CHLORIDE 0.9% FLUSH: 3.0000 mL | Freq: Once | INTRAVENOUS | Status: DC

## 2018-06-18 NOTE — ED Triage Notes (Signed)
Patient reports right/mid abdominal pain onset today , denies emesis or diarrhea , no fever or chills .

## 2018-06-19 ENCOUNTER — Encounter (HOSPITAL_COMMUNITY): Payer: Self-pay | Admitting: Emergency Medicine

## 2018-06-19 ENCOUNTER — Emergency Department (HOSPITAL_COMMUNITY): Payer: BLUE CROSS/BLUE SHIELD

## 2018-06-19 DIAGNOSIS — N200 Calculus of kidney: Secondary | ICD-10-CM | POA: Diagnosis not present

## 2018-06-19 MED ORDER — ALUM & MAG HYDROXIDE-SIMETH 200-200-20 MG/5ML PO SUSP
30.0000 mL | Freq: Once | ORAL | Status: AC
  Administered 2018-06-19: 30 mL via ORAL
  Filled 2018-06-19: qty 30

## 2018-06-19 MED ORDER — LIDOCAINE VISCOUS HCL 2 % MT SOLN
15.0000 mL | Freq: Once | OROMUCOSAL | Status: AC
  Administered 2018-06-19: 15 mL via ORAL
  Filled 2018-06-19: qty 15

## 2018-06-19 MED ORDER — SUCRALFATE 1 GM/10ML PO SUSP: 1.0000 g | Freq: Three times a day (TID) | ORAL | 0 refills | Status: AC

## 2018-06-19 MED ORDER — SUCRALFATE 1 GM/10ML PO SUSP: 1.0000 g | Freq: Three times a day (TID) | ORAL | 0 refills | Status: DC

## 2018-06-19 MED ORDER — SIMETHICONE 80 MG PO CHEW
40.0000 mg | CHEWABLE_TABLET | Freq: Once | ORAL | Status: AC
  Administered 2018-06-19: 40 mg via ORAL
  Filled 2018-06-19: qty 1

## 2018-06-19 MED ORDER — HYOSCYAMINE SULFATE 0.125 MG SL SUBL
0.2500 mg | SUBLINGUAL_TABLET | Freq: Once | SUBLINGUAL | Status: AC
  Administered 2018-06-19: 0.25 mg via SUBLINGUAL
  Filled 2018-06-19: qty 2

## 2018-06-19 NOTE — ED Notes (Signed)
To ct

## 2018-06-19 NOTE — ED Provider Notes (Signed)
[facility] EMERGENCY DEPARTMENT Provider Note   CSN: [id number] Arrival date & time: 06/18/18  2037    History   Chief Complaint Chief Complaint  Patient presents with  . Abdominal Pain    HPI Annette Roberson is a 48 y.o. female.     The history is provided by the patient.  Abdominal Pain  Pain location:  Epigastric, LUQ and RUQ Pain quality: cramping   Pain radiates to:  Does not radiate Pain severity:  Severe Onset quality:  Gradual Duration:  1 day Timing:  Constant Progression:  Partially resolved Chronicity:  New Context: not alcohol use and not medication withdrawal   Relieved by:  Nothing Worsened by:  Nothing Ineffective treatments:  None tried Associated symptoms: no anorexia, no chest pain, no diarrhea, no dysuria, no fever, no nausea, no shortness of breath and no vomiting   Risk factors: not obese     Past Medical History:  Diagnosis Date  . Family history of adverse reaction to anesthesia    "[name]" (04/10/2016)  . Migraines    "monthly" (04/10/2016)  . Papillary thyroid carcinoma ([place])   . PONV (postoperative nausea and vomiting)   . Thyroid nodule     Patient Active Problem List   Diagnosis Date Noted  . Neck abscess 04/18/2016  . Cellulitis 04/18/2016  . Thyroid cancer ([place]) 04/10/2016    Past Surgical History:  Procedure Laterality Date  . INCISION AND DRAINAGE ABSCESS Right 04/19/2016   Procedure: INCISION AND DRAINAGE ABSCESS;  Surgeon: Jerrell Belfast, MD;  Location: [place];  Service: ENT;  Laterality: Right;  . RADICAL NECK DISSECTION Left 04/10/2016   Procedure: NECK DISSECTION;  Surgeon: Izora Gala, MD;  Location: [place];  Service: ENT;  Laterality: Left;  . THYROIDECTOMY N/A 04/10/2016   Procedure: THYROIDECTOMY TOTAL;  Surgeon: Izora Gala, MD;  Location: [place];  Service: ENT;  Laterality: N/A;  . TONSILLECTOMY    . TYMPANOSTOMY TUBE PLACEMENT Bilateral   . VARICOSE VEIN SURGERY Bilateral 2000s   . WOUND EXPLORATION Right 04/19/2016   Procedure: WOUND EXPLORATION;  Surgeon: Jerrell Belfast, MD;  Location: [place];  Service: ENT;  Laterality: Right;     OB History   No obstetric history on file.      Home Medications    Prior to Admission medications   Medication Sig Start Date End Date Taking? Authorizing Provider  amitriptyline (ELAVIL) 50 MG tablet TAKE 1 TABLET AT BEDTIME Patient taking differently: TAKE 100mg  TABLET AT BEDTIME 07/15/13   Penumalli, Earlean Polka, MD  amoxicillin-clavulanate (AUGMENTIN) 500-125 MG tablet Take 1 tablet (500 mg total) by mouth 2 (two) times daily. 04/23/16   Jerrell Belfast, MD  calcium carbonate (OS-CAL - DOSED IN MG OF ELEMENTAL CALCIUM) 1250 (500 Ca) MG tablet Take 1 tablet (500 mg of elemental calcium total) by mouth 3 (three) times daily with meals. 04/13/16   Svetlana Montane, MD  Cholecalciferol (VITAMIN D3) 1000 units CAPS Take 1,000 Units by mouth at bedtime.    [provider]  HYDROcodone-acetaminophen (NORCO) 7.5-325 MG tablet Take 1 tablet by mouth every 6 (six) hours as needed for moderate pain. Patient not taking: Reported on 06/16/2016 04/10/16   Izora Gala, MD  levothyroxine (SYNTHROID) 100 MCG tablet Take 1 tablet (100 mcg total) by mouth daily. Patient taking differently: Take 100 mcg by mouth daily.  04/10/16   Izora Gala, MD  levothyroxine (SYNTHROID) 100 MCG tablet Take 1 tablet (100 mcg total) by mouth daily.  04/18/16   Izora Gala, MD  norethindrone-ethinyl estradiol (MICROGESTIN,JUNEL,LOESTRIN) 1-20 MG-MCG tablet Take 1 tablet by mouth daily.  02/19/16   [provider]  RELPAX 40 MG tablet TAKE 1 TABLET AS NEEDED FOR HEADACHE, MAY REPEAT ONCE AFTER 30 MINUTES, MAXIMUM 2 TABLETS PER 24 HOURS, MAXIMUM 8 PER MONTH 07/15/13   Penumalli, Vikram R, MD  topiramate (TOPAMAX) 100 MG tablet TAKE 1 TABLET TWICE A DAY (NEED APPOINTMENT) Patient taking differently: Take 100mg s once daily at night 09/09/13   Penumalli, Earlean Polka, MD    Family History History reviewed. No pertinent family history.  Social History Social History   Tobacco Use  . Smoking status: Never Smoker  . Smokeless tobacco: Never Used  Substance Use Topics  . Alcohol use: No  . Drug use: No     Allergies   Patient has no known allergies.   Review of Systems Review of Systems  Constitutional: Negative for fever.  Respiratory: Negative for shortness of breath.   Cardiovascular: Negative for chest pain and leg swelling.  Gastrointestinal: Positive for abdominal pain. Negative for anorexia, diarrhea, nausea and vomiting.  Genitourinary: Negative for dysuria.  All other systems reviewed and are negative.    Physical Exam Updated Vital Signs BP 118/82   Pulse 92   Temp 98.1 F (36.7 C) (Oral)   Resp 19   SpO2 100%   Physical Exam Vitals signs and nursing note reviewed.  Constitutional:      General: She is not in acute distress.    Appearance: Normal appearance.  HENT:     Head: Normocephalic and atraumatic.     Nose: Nose normal.     Mouth/Throat:     Mouth: Mucous membranes are moist.     Pharynx: Oropharynx is clear.  Eyes:     Conjunctiva/sclera: Conjunctivae normal.     Pupils: Pupils are equal, round, and reactive to light.  Neck:     Musculoskeletal: Normal range of motion and neck supple.  Cardiovascular:     Rate and Rhythm: Normal rate and regular rhythm.     Pulses: Normal pulses.     Heart sounds: Normal heart sounds.  Pulmonary:     Effort: Pulmonary effort is normal.     Breath sounds: Normal breath sounds.  Abdominal:     General: Abdomen is flat.     Tenderness: Negative signs include Murphy's sign, Rovsing's sign and McBurney's sign.     Comments: Gassy throughout  Musculoskeletal: Normal range of motion.  Skin:    General: Skin is dry.     Capillary Refill: Capillary refill takes less than 2 seconds.  Neurological:     General: No focal deficit present.     Mental Status: She is  alert and oriented to person, place, and time.  Psychiatric:        Mood and Affect: Mood normal.        Behavior: Behavior normal.      ED Treatments / Results  Labs (all labs ordered are listed, but only abnormal results are displayed) Results for orders placed or performed during the hospital encounter of 06/18/18  Lipase, blood  Result Value Ref Range   Lipase 56 (H) 11 - 51 U/L  Comprehensive metabolic panel  Result Value Ref Range   Sodium 140 135 - 145 mmol/L   Potassium 3.9 3.5 - 5.1 mmol/L   Chloride 106 98 - 111 mmol/L   CO2 26 22 - 32 mmol/L   Glucose, Bld 157 (  H) 70 - 99 mg/dL   BUN 22 (H) 6 - 20 mg/dL   Creatinine, Ser 0.89 0.44 - 1.00 mg/dL   Calcium 8.7 (L) 8.9 - 10.3 mg/dL   Total Protein 6.3 (L) 6.5 - 8.1 g/dL   Albumin 3.8 3.5 - 5.0 g/dL   AST 28 15 - 41 U/L   ALT 39 0 - 44 U/L   Alkaline Phosphatase 21 (L) 38 - 126 U/L   Total Bilirubin 0.3 0.3 - 1.2 mg/dL   GFR calc non Af Amer >60 >60 mL/min   GFR calc Af Amer >60 >60 mL/min   Anion gap 8 5 - 15  CBC  Result Value Ref Range   WBC 9.3 4.0 - 10.5 K/uL   RBC 4.54 3.87 - 5.11 MIL/uL   Hemoglobin 13.9 12.0 - 15.0 g/dL   HCT 42.3 36.0 - 46.0 %   MCV 93.2 80.0 - 100.0 fL   MCH 30.6 26.0 - 34.0 pg   MCHC 32.9 30.0 - 36.0 g/dL   RDW 12.6 11.5 - 15.5 %   Platelets 208 150 - 400 K/uL   nRBC 0.0 0.0 - 0.2 %  Urinalysis, Routine w reflex microscopic  Result Value Ref Range   Color, Urine YELLOW YELLOW   APPearance CLEAR CLEAR   Specific Gravity, Urine 1.025 1.005 - 1.030   pH 6.0 5.0 - 8.0   Glucose, UA NEGATIVE NEGATIVE mg/dL   Hgb urine dipstick NEGATIVE NEGATIVE   Bilirubin Urine NEGATIVE NEGATIVE   Ketones, ur 5 (A) NEGATIVE mg/dL   Protein, ur NEGATIVE NEGATIVE mg/dL   Nitrite NEGATIVE NEGATIVE   Leukocytes,Ua NEGATIVE NEGATIVE  I-Stat beta hCG blood, ED  Result Value Ref Range   I-stat hCG, quantitative <5.0 <5 mIU/mL   Comment 3           Ct Renal Stone Study  Result Date:  06/19/2018 CLINICAL DATA:  48 year old female with upper abdominal pain. EXAM: CT ABDOMEN AND PELVIS WITHOUT CONTRAST TECHNIQUE: Multidetector CT imaging of the abdomen and pelvis was performed following the standard protocol without IV contrast. COMPARISON:  None. FINDINGS: Evaluation of this exam is limited in the absence of intravenous contrast. Lower chest: The visualized lung bases are clear. No intra-abdominal free air or free fluid. Hepatobiliary: No focal liver abnormality is seen. No gallstones, gallbladder wall thickening, or biliary dilatation. Pancreas: Unremarkable. No pancreatic ductal dilatation or surrounding inflammatory changes. Spleen: Normal in size without focal abnormality. Adrenals/Urinary Tract: The adrenal glands are unremarkable. There is minimal fullness of the right renal collecting system. There is a punctate nonobstructing right renal interpolar calculus (series 5, image 43). Probable pelvic phleboliths. No definite stone identified along the expected course of the right ureter. The left kidney appears unremarkable. The urinary bladder is distended and grossly unremarkable. Stomach/Bowel: Evaluation of the bowel is limited in the absence of oral contrast. There is no bowel obstruction or active inflammation. The appendix is not visualized with certainty. No inflammatory changes identified in the right lower quadrant. Vascular/Lymphatic: The abdominal aorta and IVC are grossly unremarkable on this noncontrast CT. No portal venous gas. No adenopathy. Reproductive: The uterus and ovaries are grossly unremarkable. No pelvic mass. Other: None Musculoskeletal: There is a 2.5 x 3.5 cm sclerotic lesion the left sacral alum, indeterminate. This is however concerning for metastatic disease. Further evaluation with MRI or bone scan recommended. No acute osseous pathology. IMPRESSION: 1. No hydronephrosis or obstructing stone. 2. No bowel obstruction or active inflammation. 3. Left sacral alar  sclerotic lesion, indeterminate. Further evaluation with MRI or bone scan recommended. Electronically Signed   By: Anner Crete M.D.   On: 06/19/2018 02:51    EKG None  Radiology Ct Renal Stone Study  Result Date: 06/19/2018 CLINICAL DATA:  48 year old female with upper abdominal pain. EXAM: CT ABDOMEN AND PELVIS WITHOUT CONTRAST TECHNIQUE: Multidetector CT imaging of the abdomen and pelvis was performed following the standard protocol without IV contrast. COMPARISON:  None. FINDINGS: Evaluation of this exam is limited in the absence of intravenous contrast. Lower chest: The visualized lung bases are clear. No intra-abdominal free air or free fluid. Hepatobiliary: No focal liver abnormality is seen. No gallstones, gallbladder wall thickening, or biliary dilatation. Pancreas: Unremarkable. No pancreatic ductal dilatation or surrounding inflammatory changes. Spleen: Normal in size without focal abnormality. Adrenals/Urinary Tract: The adrenal glands are unremarkable. There is minimal fullness of the right renal collecting system. There is a punctate nonobstructing right renal interpolar calculus (series 5, image 43). Probable pelvic phleboliths. No definite stone identified along the expected course of the right ureter. The left kidney appears unremarkable. The urinary bladder is distended and grossly unremarkable. Stomach/Bowel: Evaluation of the bowel is limited in the absence of oral contrast. There is no bowel obstruction or active inflammation. The appendix is not visualized with certainty. No inflammatory changes identified in the right lower quadrant. Vascular/Lymphatic: The abdominal aorta and IVC are grossly unremarkable on this noncontrast CT. No portal venous gas. No adenopathy. Reproductive: The uterus and ovaries are grossly unremarkable. No pelvic mass. Other: None Musculoskeletal: There is a 2.5 x 3.5 cm sclerotic lesion the left sacral alum, indeterminate. This is however concerning for  metastatic disease. Further evaluation with MRI or bone scan recommended. No acute osseous pathology. IMPRESSION: 1. No hydronephrosis or obstructing stone. 2. No bowel obstruction or active inflammation. 3. Left sacral alar sclerotic lesion, indeterminate. Further evaluation with MRI or bone scan recommended. Electronically Signed   By: Anner Crete M.D.   On: 06/19/2018 02:51    Procedures Procedures (including critical care time)  Medications Ordered in ED Medications  sodium chloride flush (NS) 0.9 % injection 3 mL (has no administration in time range)  alum & mag hydroxide-simeth (MAALOX/MYLANTA) 200-200-20 MG/5ML suspension 30 mL (30 mLs Oral Given 06/19/18 0204)    And  lidocaine (XYLOCAINE) 2 % viscous mouth solution 15 mL (15 mLs Oral Given 06/19/18 0204)  hyoscyamine (LEVSIN SL) SL tablet 0.25 mg (0.25 mg Sublingual Given 06/19/18 0202)  simethicone (MYLICON) chewable tablet 40 mg (40 mg Oral Given 06/19/18 0212)    Have discussed sclerotic lesions seen on CT scan and need for outpatient MRI.  Please have your doctor order this study.  Patient and husband express understanding and agree to follow  Final Clinical Impressions(s) / ED Diagnoses   Return for pain, intractable cough, fevers >100.4 unrelieved by medication, shortness of breath, intractable vomiting, chest pain, shortness of breath, weakness numbness, changes in speech, facial asymmetry,abdominal pain, passing out,Inability to tolerate liquids or food, cough, altered mental status or any concerns. No signs of systemic illness or infection. The patient is nontoxic-appearing on exam and vital signs are within normal limits.   I have reviewed the triage vital signs and the nursing notes. Pertinent labs &imaging results that were available during my care of the patient were reviewed by me and considered in my medical decision making (see chart for details).  After history, exam, and medical workup I feel the patient  has been appropriately medically screened  and is safe for discharge home. Pertinent diagnoses were discussed with the patient. Patient was given return precautions.    Aziyah Provencal, MD 06/19/18 [phone]

## 2018-06-20 DIAGNOSIS — M899 Disorder of bone, unspecified: Secondary | ICD-10-CM | POA: Diagnosis not present

## 2018-06-29 DIAGNOSIS — M899 Disorder of bone, unspecified: Secondary | ICD-10-CM | POA: Diagnosis not present

## 2018-09-06 DIAGNOSIS — M898X8 Other specified disorders of bone, other site: Secondary | ICD-10-CM | POA: Diagnosis not present

## 2018-09-06 DIAGNOSIS — M899 Disorder of bone, unspecified: Secondary | ICD-10-CM | POA: Diagnosis not present

## 2018-09-10 ENCOUNTER — Other Ambulatory Visit (HOSPITAL_COMMUNITY): Payer: Self-pay | Admitting: *Deleted

## 2018-09-10 DIAGNOSIS — R1900 Intra-abdominal and pelvic swelling, mass and lump, unspecified site: Secondary | ICD-10-CM

## 2018-09-14 ENCOUNTER — Other Ambulatory Visit (HOSPITAL_COMMUNITY): Payer: Self-pay | Admitting: *Deleted

## 2018-09-14 ENCOUNTER — Ambulatory Visit
Admission: RE | Admit: 2018-09-14 | Discharge: 2018-09-14 | Disposition: A | Discharge status: Home / Self Care | Payer: Self-pay | Source: Ambulatory Visit | Attending: *Deleted | Admitting: *Deleted

## 2018-09-14 ENCOUNTER — Telehealth (HOSPITAL_COMMUNITY): Payer: Self-pay

## 2018-09-14 DIAGNOSIS — R52 Pain, unspecified: Secondary | ICD-10-CM

## 2018-10-05 ENCOUNTER — Ambulatory Visit
Admission: RE | Admit: 2018-10-05 | Discharge: 2018-10-05 | Disposition: A | Discharge status: Home / Self Care | Payer: Self-pay | Source: Ambulatory Visit | Attending: *Deleted | Admitting: *Deleted

## 2018-10-05 ENCOUNTER — Other Ambulatory Visit (HOSPITAL_COMMUNITY): Payer: Self-pay | Admitting: *Deleted

## 2018-10-05 DIAGNOSIS — R52 Pain, unspecified: Secondary | ICD-10-CM

## 2019-02-24 DIAGNOSIS — C73 Malignant neoplasm of thyroid gland: Secondary | ICD-10-CM | POA: Diagnosis not present

## 2019-02-24 DIAGNOSIS — E209 Hypoparathyroidism, unspecified: Secondary | ICD-10-CM | POA: Diagnosis not present

## 2019-02-24 DIAGNOSIS — E890 Postprocedural hypothyroidism: Secondary | ICD-10-CM | POA: Diagnosis not present

## 2019-03-03 DIAGNOSIS — E209 Hypoparathyroidism, unspecified: Secondary | ICD-10-CM | POA: Diagnosis not present

## 2019-03-03 DIAGNOSIS — E890 Postprocedural hypothyroidism: Secondary | ICD-10-CM | POA: Diagnosis not present

## 2019-03-03 DIAGNOSIS — C73 Malignant neoplasm of thyroid gland: Secondary | ICD-10-CM | POA: Diagnosis not present

## 2019-03-28 DIAGNOSIS — G43829 Menstrual migraine, not intractable, without status migrainosus: Secondary | ICD-10-CM | POA: Diagnosis not present

## 2019-03-28 DIAGNOSIS — Z01419 Encounter for gynecological examination (general) (routine) without abnormal findings: Secondary | ICD-10-CM | POA: Diagnosis not present

## 2019-03-28 DIAGNOSIS — Z6823 Body mass index (BMI) 23.0-23.9, adult: Secondary | ICD-10-CM | POA: Diagnosis not present

## 2019-03-31 DIAGNOSIS — E209 Hypoparathyroidism, unspecified: Secondary | ICD-10-CM | POA: Diagnosis not present

## 2019-05-03 DIAGNOSIS — E209 Hypoparathyroidism, unspecified: Secondary | ICD-10-CM | POA: Diagnosis not present

## 2019-05-30 IMAGING — NM NM [ID] THYROID CANCER METS WHOLE BODY W/ THYROGEN
6 series · 6 of 6 positions shown · non-contrast
Comparison: 04/03/2017

CLINICAL DATA: Thyroid cancer post thyroidectomy and postoperative
ablation, follow-up

EXAM:
THYROGEN-STIMULATED L-9A9 WHOLE BODY SCAN
TECHNIQUE: The patient received 0.9 mg Thyrogen intramuscularly every 24 hours
for two doses. On the third day the patient returned and received
the radiopharmaceutical, per orally. On the fifth day, the patient
returned and whole body planar images were obtained in the anterior
and posterior projections.
RADIOPHARMACEUTICALS:  4 mCi L-9A9 sodium iodide orally

[Series 1: marker · 4.14mm/px · 1 of 1 slices shown (1 of 2)]
[im 1/1  full-range]
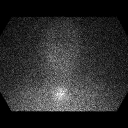

[Series 1: marker · 4.14mm/px · 1 of 1 slices shown (2 of 2)]
[im 1/1]
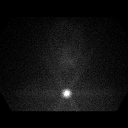

[Series 2: static thyroid no marker · 4.14mm/px · 1 of 1 slices shown (1 of 2)]
[im 1/1  full-range]
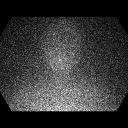

[Series 2: static thyroid no marker · 4.14mm/px · 1 of 1 slices shown (2 of 2)]
[im 1/1]
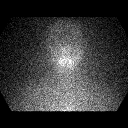

[Series 3: i131 whole body · 2.66mm/px · 1 of 1 slices shown (1 of 2)]
[im 1/1  full-range]
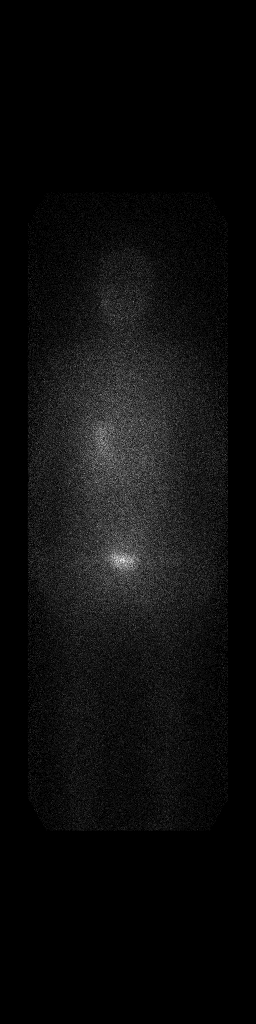

[Series 3: i131 whole body · 2.66mm/px · 1 of 1 slices shown (2 of 2)]
[im 1/1]
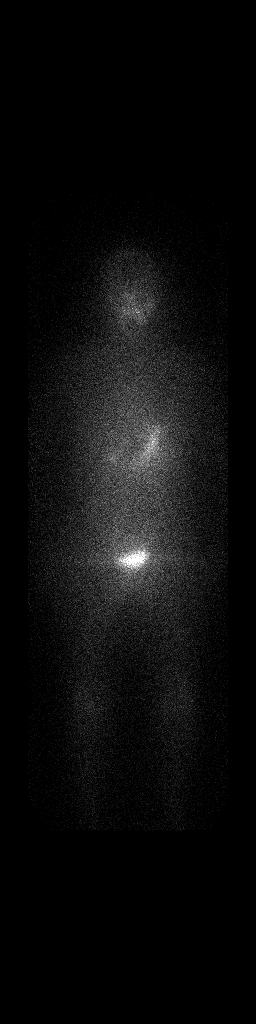

[6 of 6 positions shown; findings below may reference images not displayed]

FINDINGS: No residual radio iodine accumulation at the cervical region.

Expected physiologic localization of tracer within the stomach and
urinary bladder.

No abnormal sites of radio iodine accumulation are identified to
suggest iodine-avid metastatic thyroid cancer.
IMPRESSION: No scintigraphic evidence of iodine-avid metastatic thyroid cancer.

## 2019-10-11 ENCOUNTER — Encounter: Payer: Self-pay | Admitting: Internal Medicine

## 2019-12-08 ENCOUNTER — Ambulatory Visit: Payer: Self-pay | Admitting: Internal Medicine

## 2020-02-22 DIAGNOSIS — E209 Hypoparathyroidism, unspecified: Secondary | ICD-10-CM | POA: Diagnosis not present

## 2020-02-22 DIAGNOSIS — E890 Postprocedural hypothyroidism: Secondary | ICD-10-CM | POA: Diagnosis not present

## 2020-02-22 DIAGNOSIS — C73 Malignant neoplasm of thyroid gland: Secondary | ICD-10-CM | POA: Diagnosis not present

## 2020-02-25 DIAGNOSIS — Z20822 Contact with and (suspected) exposure to covid-19: Secondary | ICD-10-CM | POA: Diagnosis not present

## 2020-02-25 DIAGNOSIS — Z03818 Encounter for observation for suspected exposure to other biological agents ruled out: Secondary | ICD-10-CM | POA: Diagnosis not present

## 2020-03-07 DIAGNOSIS — C73 Malignant neoplasm of thyroid gland: Secondary | ICD-10-CM | POA: Diagnosis not present

## 2020-03-07 DIAGNOSIS — E209 Hypoparathyroidism, unspecified: Secondary | ICD-10-CM | POA: Diagnosis not present

## 2020-03-07 DIAGNOSIS — E890 Postprocedural hypothyroidism: Secondary | ICD-10-CM | POA: Diagnosis not present

## 2020-03-28 DIAGNOSIS — Z01419 Encounter for gynecological examination (general) (routine) without abnormal findings: Secondary | ICD-10-CM | POA: Diagnosis not present

## 2020-03-28 DIAGNOSIS — Z6825 Body mass index (BMI) 25.0-25.9, adult: Secondary | ICD-10-CM | POA: Diagnosis not present

## 2020-05-14 DIAGNOSIS — E890 Postprocedural hypothyroidism: Secondary | ICD-10-CM | POA: Diagnosis not present

## 2020-07-03 DIAGNOSIS — Z13820 Encounter for screening for osteoporosis: Secondary | ICD-10-CM | POA: Diagnosis not present

## 2020-07-03 DIAGNOSIS — Z1231 Encounter for screening mammogram for malignant neoplasm of breast: Secondary | ICD-10-CM | POA: Diagnosis not present

## 2020-09-10 DIAGNOSIS — H903 Sensorineural hearing loss, bilateral: Secondary | ICD-10-CM | POA: Diagnosis not present

## 2020-09-19 DIAGNOSIS — H903 Sensorineural hearing loss, bilateral: Secondary | ICD-10-CM | POA: Diagnosis not present

## 2021-01-30 DIAGNOSIS — G4700 Insomnia, unspecified: Secondary | ICD-10-CM | POA: Diagnosis not present

## 2021-01-30 DIAGNOSIS — F418 Other specified anxiety disorders: Secondary | ICD-10-CM | POA: Diagnosis not present

## 2021-03-01 DIAGNOSIS — E890 Postprocedural hypothyroidism: Secondary | ICD-10-CM | POA: Diagnosis not present

## 2021-03-01 DIAGNOSIS — E209 Hypoparathyroidism, unspecified: Secondary | ICD-10-CM | POA: Diagnosis not present

## 2021-03-01 DIAGNOSIS — C73 Malignant neoplasm of thyroid gland: Secondary | ICD-10-CM | POA: Diagnosis not present

## 2021-03-08 DIAGNOSIS — E209 Hypoparathyroidism, unspecified: Secondary | ICD-10-CM | POA: Diagnosis not present

## 2021-03-08 DIAGNOSIS — C73 Malignant neoplasm of thyroid gland: Secondary | ICD-10-CM | POA: Diagnosis not present

## 2021-03-08 DIAGNOSIS — E890 Postprocedural hypothyroidism: Secondary | ICD-10-CM | POA: Diagnosis not present

## 2021-04-16 DIAGNOSIS — Z01419 Encounter for gynecological examination (general) (routine) without abnormal findings: Secondary | ICD-10-CM | POA: Diagnosis not present

## 2021-04-16 DIAGNOSIS — Z6822 Body mass index (BMI) 22.0-22.9, adult: Secondary | ICD-10-CM | POA: Diagnosis not present

## 2021-06-06 ENCOUNTER — Encounter: Payer: Self-pay | Admitting: Internal Medicine

## 2021-07-09 ENCOUNTER — Ambulatory Visit (AMBULATORY_SURGERY_CENTER): Payer: Self-pay | Admitting: *Deleted

## 2021-07-09 ENCOUNTER — Other Ambulatory Visit: Payer: Self-pay

## 2021-07-09 VITALS — Ht 66.0 in | Wt 137.0 lb

## 2021-07-09 DIAGNOSIS — Z1211 Encounter for screening for malignant neoplasm of colon: Secondary | ICD-10-CM

## 2021-07-09 MED ORDER — NA SULFATE-K SULFATE-MG SULF 17.5-3.13-1.6 GM/177ML PO SOLN: 1.0000 | Freq: Once | ORAL | 0 refills | Status: AC

## 2021-07-09 NOTE — Progress Notes (Signed)

## 2021-07-19 ENCOUNTER — Encounter: Payer: Self-pay | Admitting: Internal Medicine

## 2021-07-23 ENCOUNTER — Encounter: Payer: Self-pay | Admitting: Internal Medicine

## 2021-07-23 ENCOUNTER — Ambulatory Visit (AMBULATORY_SURGERY_CENTER): Payer: BC Managed Care – PPO | Admitting: Internal Medicine

## 2021-07-23 VITALS — BP 110/74 | HR 91 | Temp 97.5°F | Resp 18 | Ht 66.0 in | Wt 137.0 lb

## 2021-07-23 DIAGNOSIS — Z1211 Encounter for screening for malignant neoplasm of colon: Secondary | ICD-10-CM | POA: Diagnosis present

## 2021-07-23 MED ORDER — SODIUM CHLORIDE 0.9 % IV SOLN: 500.0000 mL | Freq: Once | INTRAVENOUS | Status: DC

## 2021-07-23 NOTE — Progress Notes (Signed)
HISTORY OF PRESENT ILLNESS: ? ?Annette Roberson is a 51 y.o. female presents today for routine screening colonoscopy.  No active GI complaints.  Index exam ? ?REVIEW OF SYSTEMS: ? ?All non-GI ROS negative. ?Past Medical History:  ?Diagnosis Date  ? Constipation   ? Family history of adverse reaction to anesthesia   ? "[name]" (04/10/2016)  ? Migraines   ? "monthly" (04/10/2016)  ? Papillary thyroid carcinoma ([place])   ? PONV (postoperative nausea and vomiting)   ? Thyroid nodule   ? ? ?Past Surgical History:  ?Procedure Laterality Date  ? ADENOIDECTOMY    ? INCISION AND DRAINAGE ABSCESS Right 04/19/2016  ? Procedure: INCISION AND DRAINAGE ABSCESS;  Surgeon: Jerrell Belfast, MD;  Location: [place];  Service: ENT;  Laterality: Right;  ? RADICAL NECK DISSECTION Left 04/10/2016  ? Procedure: NECK DISSECTION;  Surgeon: Izora Gala, MD;  Location: [place];  Service: ENT;  Laterality: Left;  ? THYROIDECTOMY N/A 04/10/2016  ? Procedure: THYROIDECTOMY TOTAL;  Surgeon: Izora Gala, MD;  Location: [place];  Service: ENT;  Laterality: N/A;  ? TONSILLECTOMY    ? TYMPANOSTOMY TUBE PLACEMENT Bilateral   ? VARICOSE VEIN SURGERY Bilateral 2000s  ? WOUND EXPLORATION Right 04/19/2016  ? Procedure: WOUND EXPLORATION;  Surgeon: Jerrell Belfast, MD;  Location: [place];  Service: ENT;  Laterality: Right;  ? ? ?Social History ?Annette Roberson  reports that she has never smoked. She has never used smokeless tobacco. She reports that she does not drink alcohol and does not use drugs. ? ?family history is not on file. ? ?No Known Allergies ? ?  ? ?PHYSICAL EXAMINATION: ? ?Vital signs: BP 125/82   Pulse 86   Temp (!) 97.5 ?F (36.4 ?C) (Skin)   Resp 15   Ht '5\' 6"'$  (1.676 m)   Wt 137 lb (62.1 kg)   SpO2 100%   BMI 22.11 kg/m?  ?General: Well-developed, well-nourished, no acute distress ?HEENT: Sclerae are anicteric, conjunctiva pink. Oral mucosa intact ?Lungs: Clear ?Heart: Regular ?Abdomen: soft, nontender, nondistended, no obvious  ascites, no peritoneal signs, normal bowel sounds. No organomegaly. ?Extremities: No edema ?Psychiatric: alert and oriented x3. Cooperative  ? ? ? ?ASSESSMENT: ? ?1.  Colon cancer screening.  Average risk ? ? ?PLAN: ? ? ? ?1.  Screening colonoscopy ? ? ?  ?

## 2021-07-23 NOTE — Patient Instructions (Signed)
Handout given.  ? ? ?YOU HAD AN ENDOSCOPIC PROCEDURE TODAY AT [facility]:   Refer to the procedure report that was given to you for any specific questions about what was found during the examination.  If the procedure report does not answer your questions, please call your gastroenterologist to clarify.  If you requested that your care partner not be given the details of your procedure findings, then the procedure report has been included in a sealed envelope for you to review at your convenience later. ? ?YOU SHOULD EXPECT: Some feelings of bloating in the abdomen. Passage of more gas than usual.  Walking can help get rid of the air that was put into your GI tract during the procedure and reduce the bloating. If you had a lower endoscopy (such as a colonoscopy or flexible sigmoidoscopy) you may notice spotting of blood in your stool or on the toilet paper. If you underwent a bowel prep for your procedure, you may not have a normal bowel movement for a few days. ? ?Please Note:  You might notice some irritation and congestion in your nose or some drainage.  This is from the oxygen used during your procedure.  There is no need for concern and it should clear up in a day or so. ? ?SYMPTOMS TO REPORT IMMEDIATELY: ? ?Following lower endoscopy (colonoscopy or flexible sigmoidoscopy): ? Excessive amounts of blood in the stool ? Significant tenderness or worsening of abdominal pains ? Swelling of the abdomen that is new, acute ? Fever of 100?F or higher ? ? ? ?For urgent or emergent issues, a gastroenterologist can be reached at any hour by calling [phone]. ?Do not use MyChart messaging for urgent concerns.  ? ? ?DIET:  We do recommend a small meal at first, but then you may proceed to your regular diet.  Drink plenty of fluids but you should avoid alcoholic beverages for 24 hours. ? ?ACTIVITY:  You should plan to take it easy for the rest of today and you should NOT DRIVE or use heavy machinery  until tomorrow (because of the sedation medicines used during the test).   ? ?FOLLOW UP: ?Our staff will call the number listed on your records 48-72 hours following your procedure to check on you and address any questions or concerns that you may have regarding the information given to you following your procedure. If we do not reach you, we will leave a message.  We will attempt to reach you two times.  During this call, we will ask if you have developed any symptoms of COVID 19. If you develop any symptoms (ie: fever, flu-like symptoms, shortness of breath, cough etc.) before then, please call [phone].  If you test positive for Covid 19 in the 2 weeks post procedure, please call and report this information to [place].   ? ?If any biopsies were taken you will be contacted by phone or by letter within the next 1-3 weeks.  Please call us at [phone] if you have not heard about the biopsies in 3 weeks.  ? ? ?SIGNATURES/CONFIDENTIALITY: ?You and/or your care partner have signed paperwork which will be entered into your electronic medical record.  These signatures attest to the fact that that the information above on your After Visit Summary has been reviewed and is understood.  Full responsibility of the confidentiality of this discharge information lies with you and/or your care-partner.  ?

## 2021-07-23 NOTE — Progress Notes (Signed)
Pt's states no medical or surgical changes since previsit or office visit. 

## 2021-07-23 NOTE — Op Note (Signed)
[place] ?Patient Name: Annette Roberson ?Procedure Date: 07/23/2021 11:14 AM ?MRN: [id number] ?Endoscopist: Docia Chuck. Henrene Pastor , MD ?Age: 51 ?Referring MD:  ?Date of Birth: 04-22-1970 ?Gender: Female ?Account #: [id number] ?Procedure:                Colonoscopy ?Indications:              Screening for colorectal malignant neoplasm ?Medicines:                Monitored Anesthesia Care ?Procedure:                Pre-Anesthesia Assessment: ?                          - Prior to the procedure, a History and Physical  ?                          was performed, and patient medications and  ?                          allergies were reviewed. The patient's tolerance of  ?                          previous anesthesia was also reviewed. The risks  ?                          and benefits of the procedure and the sedation  ?                          options and risks were discussed with the patient.  ?                          All questions were answered, and informed consent  ?                          was obtained. Prior Anticoagulants: The patient has  ?                          taken no previous anticoagulant or antiplatelet  ?                          agents. ASA Grade Assessment: II - A patient with  ?                          mild systemic disease. After reviewing the risks  ?                          and benefits, the patient was deemed in  ?                          satisfactory condition to undergo the procedure. ?                          After obtaining informed consent, the colonoscope  ?  was passed under direct vision. Throughout the  ?                          procedure, the patient's blood pressure, pulse, and  ?                          oxygen saturations were monitored continuously. The  ?                          Olympus CF-[id number] (#[id number]) Colonoscope was  ?                          introduced through the anus and advanced to the the  ?                          cecum, identified  by appendiceal orifice and  ?                          ileocecal valve. The ileocecal valve, appendiceal  ?                          orifice, and rectum were photographed. The quality  ?                          of the bowel preparation was excellent. The  ?                          colonoscopy was performed without difficulty. The  ?                          patient tolerated the procedure well. The bowel  ?                          preparation used was Miralax via single dose  ?                          instruction followed by Suprep split dose. ?Scope In: 11:29:18 AM ?Scope Out: 11:46:06 AM ?Scope Withdrawal Time: 0 hours 9 minutes 31 seconds  ?Total Procedure Duration: 0 hours 16 minutes 48 seconds  ?Findings:                 The entire examined colon appeared normal on direct  ?                          and retroflexion views. Small internal hemorrhoids  ?                          noted. ?Complications:            No immediate complications. Estimated blood loss:  ?                          None. ?Estimated Blood Loss:     Estimated blood loss: none. ?Impression:               - The entire  examined colon is normal on direct and  ?                          retroflexion views. ?                          - No specimens collected. ?Recommendation:           - Repeat colonoscopy in 10 years for screening  ?                          purposes. ?                          - Patient has a contact number available for  ?                          emergencies. The signs and symptoms of potential  ?                          delayed complications were discussed with the  ?                          patient. Return to normal activities tomorrow.  ?                          Written discharge instructions were provided to the  ?                          patient. ?                          - Resume previous diet. ?                          - Continue present medications. ?Docia Chuck. Henrene Pastor, MD ?07/23/2021 11:51:42 AM ?This report has  been signed electronically. ?

## 2021-07-23 NOTE — Progress Notes (Signed)
Report to PACU, RN, vss, BBS= Clear.  

## 2021-07-25 ENCOUNTER — Telehealth: Payer: Self-pay

## 2021-07-25 NOTE — Telephone Encounter (Signed)
?  Follow up Call- ? ? ?  07/23/2021  ? 10:22 AM  ?Call back number  ?Post procedure Call Back phone  # [phone]  ?Permission to leave phone message Yes  ?  ? ?Patient questions: ? ?Do you have a fever, pain , or abdominal swelling? No. ?Pain Score  0 * ? ?Have you tolerated food without any problems? Yes.   ? ?Have you been able to return to your normal activities? Yes.   ? ?Do you have any questions about your discharge instructions: ?Diet   No. ?Medications  No. ?Follow up visit  No. ? ?Do you have questions or concerns about your Care? No. ? ?Actions: ?* If pain score is 4 or above: ?No action needed, pain <4. ? ? ?

## 2021-12-06 ENCOUNTER — Ambulatory Visit (INDEPENDENT_AMBULATORY_CARE_PROVIDER_SITE_OTHER): Payer: BC Managed Care – PPO

## 2021-12-06 ENCOUNTER — Ambulatory Visit (INDEPENDENT_AMBULATORY_CARE_PROVIDER_SITE_OTHER): Payer: BC Managed Care – PPO | Admitting: Orthopaedic Surgery

## 2021-12-06 DIAGNOSIS — M222X1 Patellofemoral disorders, right knee: Secondary | ICD-10-CM | POA: Diagnosis not present

## 2021-12-06 DIAGNOSIS — M25561 Pain in right knee: Secondary | ICD-10-CM

## 2021-12-06 DIAGNOSIS — G8929 Other chronic pain: Secondary | ICD-10-CM | POA: Diagnosis not present

## 2021-12-06 NOTE — Progress Notes (Signed)
Chief Complaint: Right knee pain     History of Present Illness:    Annette Roberson is a 51 y.o. female presents with right knee pain which has been ongoing now for approximately 7 weeks.  She is very active enjoys going to the gym.  She said that she experiences the pain deeply behind the knee with lunge type activity.  Any type of prolonged activity is also bothering this.  She does not have any specific history of injury or incident.  She has been attempting to run more frequently with her husband as well.  She has not had a previous injection or physical therapy    Surgical History:   None  PMH/PSH/Family History/Social History/Meds/Allergies:    Past Medical History:  Diagnosis Date   Constipation    Family history of adverse reaction to anesthesia    "[name]" (04/10/2016)   Migraines    "monthly" (04/10/2016)   Papillary thyroid carcinoma ([facility])    PONV (postoperative nausea and vomiting)    Thyroid nodule    Past Surgical History:  Procedure Laterality Date   ADENOIDECTOMY     INCISION AND DRAINAGE ABSCESS Right 04/19/2016   Procedure: INCISION AND DRAINAGE ABSCESS;  Surgeon: Jerrell Belfast, MD;  Location: [facility];  Service: ENT;  Laterality: Right;   RADICAL NECK DISSECTION Left 04/10/2016   Procedure: NECK DISSECTION;  Surgeon: Izora Gala, MD;  Location: [facility] OR;  Service: ENT;  Laterality: Left;   THYROIDECTOMY N/A 04/10/2016   Procedure: THYROIDECTOMY TOTAL;  Surgeon: Izora Gala, MD;  Location: [place];  Service: ENT;  Laterality: N/A;   TONSILLECTOMY     TYMPANOSTOMY TUBE PLACEMENT Bilateral    VARICOSE VEIN SURGERY Bilateral 2000s   WOUND EXPLORATION Right 04/19/2016   Procedure: WOUND EXPLORATION;  Surgeon: Jerrell Belfast, MD;  Location: [facility];  Service: ENT;  Laterality: Right;   Social History   Socioeconomic History   Marital status: Married    Spouse name: Not on file   Number of children: Not on file    Years of education: Not on file   Highest education level: Not on file  Occupational History   Not on file  Tobacco Use   Smoking status: Never   Smokeless tobacco: Never  Vaping Use   Vaping Use: Never used  Substance and Sexual Activity   Alcohol use: No   Drug use: No   Sexual activity: Not on file  Other Topics Concern   Not on file  Social History Narrative   Not on file   Social Determinants of Health   Financial Resource Strain: Not on file  Food Insecurity: Not on file  Transportation Needs: Not on file  Physical Activity: Not on file  Stress: Not on file  Social Connections: Not on file   Family History  Problem Relation Age of Onset   Colon cancer Neg Hx    Colon polyps Neg Hx    Esophageal cancer Neg Hx    Rectal cancer Neg Hx    Stomach cancer Neg Hx    No Known Allergies Current Outpatient Medications  Medication Sig Dispense Refill   amitriptyline (ELAVIL) 50 MG tablet TAKE 1 TABLET AT BEDTIME 30 tablet 0   Ascorbic Acid (VITAMIN C) 1000 MG tablet Take 1,000 mg by mouth daily.  CALCITRIOL PO Take by mouth daily. TAKE 0.5 MCG AND .25 MCG     calcium carbonate (OS-CAL - DOSED IN MG OF ELEMENTAL CALCIUM) 1250 (500 Ca) MG tablet Take 1 tablet (500 mg of elemental calcium total) by mouth 3 (three) times daily with meals. 90 tablet 0   Cyanocobalamin (VITAMIN B-12 PO) Take 5,000 mcg by mouth. DISSOLVE IN MOUTH ONCE A DAY KIRKLAND BRAND     docusate sodium (COLACE) 100 MG capsule Take 100 mg by mouth daily.     levothyroxine (SYNTHROID) 100 MCG tablet Take 1 tablet (100 mcg total) by mouth daily. (Patient taking differently: Take 100 mcg by mouth daily.) 30 tablet 6   linaclotide (LINZESS) 290 MCG CAPS capsule Take 290 mcg by mouth daily.     Multiple Vitamins-Minerals (MULTIVITAMIN WOMEN 50+ PO) Take by mouth daily.     RELPAX 40 MG tablet TAKE 1 TABLET AS NEEDED FOR HEADACHE, MAY REPEAT ONCE AFTER 30 MINUTES, MAXIMUM 2 TABLETS PER 24 HOURS, MAXIMUM 8 PER  MONTH (Patient taking differently: as needed.) 8 tablet 0   sucralfate (CARAFATE) 1 GM/10ML suspension Take 10 mLs (1 g total) by mouth 4 (four) times daily -  with meals and at bedtime. (Patient not taking: Reported on 07/09/2021) 420 mL 0   topiramate (TOPAMAX) 100 MG tablet TAKE 1 TABLET TWICE A DAY (NEED APPOINTMENT) 60 tablet 0   No current facility-administered medications for this visit.   No results found.  Review of Systems:   A ROS was performed including pertinent positives and negatives as documented in the HPI.  Physical Exam :   Constitutional: NAD and appears stated age Neurological: Alert and oriented Psych: Appropriate affect and cooperative There were no vitals taken for this visit.   Comprehensive Musculoskeletal Exam:    Right tenderness about patellofemoral joint with a positive grind.  Otherwise no joint line tenderness.  Negative laxity with varus or valgus.  There is weakness with a standing leg raise.  Imaging:   Xray (4 views right knee): Normal   I personally reviewed and interpreted the radiographs.   Assessment:   51 y.o. female presents with right knee pain consistent with patellofemoral pain.  At today's visit I described the role for hip flexor strength as well as quad and core control in order to improve her patellofemoral joint pain.  This time we will plan to refer her to physical therapy for a few sessions so that she can learn activities that she can incorporate to her home routine and work.  Plan :    -Return to clinic as necessary     I personally saw and evaluated the patient, and participated in the management and treatment plan.  Vanetta Mulders, MD Attending Physician, Orthopedic Surgery  This document was dictated using Dragon voice recognition software. A reasonable attempt at proof reading has been made to minimize errors.

## 2022-01-16 NOTE — Therapy (Addendum)
OUTPATIENT PHYSICAL THERAPY LOWER EXTREMITY EVALUATION/Discharge    Patient Name: Annette Roberson MRN: [id number] DOB:1970-11-09, 51 y.o., female Today's Date: 01/17/2022   PT End of Session - 01/17/22 1524     Visit Number 1    Number of Visits 6    Date for PT Re-Evaluation 02/28/22    Authorization Type BCBS    PT Start Time 0320    PT Stop Time 0400    PT Time Calculation (min) 40 min    Activity Tolerance Patient tolerated treatment well    Behavior During Therapy [facility] for tasks assessed/performed             Past Medical History:  Diagnosis Date   Constipation    Family history of adverse reaction to anesthesia    "[name]" (04/10/2016)   Migraines    "monthly" (04/10/2016)   Papillary thyroid carcinoma ([facility])    PONV (postoperative nausea and vomiting)    Thyroid nodule    Past Surgical History:  Procedure Laterality Date   ADENOIDECTOMY     INCISION AND DRAINAGE ABSCESS Right 04/19/2016   Procedure: INCISION AND DRAINAGE ABSCESS;  Surgeon: Jerrell Belfast, MD;  Location: [place];  Service: ENT;  Laterality: Right;   RADICAL NECK DISSECTION Left 04/10/2016   Procedure: NECK DISSECTION;  Surgeon: Izora Gala, MD;  Location: [place];  Service: ENT;  Laterality: Left;   THYROIDECTOMY N/A 04/10/2016   Procedure: THYROIDECTOMY TOTAL;  Surgeon: Izora Gala, MD;  Location: [place];  Service: ENT;  Laterality: N/A;   TONSILLECTOMY     TYMPANOSTOMY TUBE PLACEMENT Bilateral    VARICOSE VEIN SURGERY Bilateral 2000s   WOUND EXPLORATION Right 04/19/2016   Procedure: WOUND EXPLORATION;  Surgeon: Jerrell Belfast, MD;  Location: [place];  Service: ENT;  Laterality: Right;   Patient Active Problem List   Diagnosis Date Noted   Neck abscess 04/18/2016   Cellulitis 04/18/2016   Thyroid cancer ([place]) 04/10/2016    PCP: Everardo Beals NP  REFERRING PROVIDER: Vanetta Mulders MD   REFERRING DIAG: Right Knee Pain   THERAPY DIAG:  Acute pain of right  knee  Rationale for Evaluation and Treatment Rehabilitation  ONSET DATE: Increased pain over the past 7 weeks  SUBJECTIVE:   SUBJECTIVE STATEMENT: The patient has had an acute onset of right knee pain over the past 7 weeks. She has increased her running prior to the onset of pain. She enjoys going to the gym.  PERTINENT HISTORY: Migraines, thyroid carcinoma  PAIN:  Are you having pain? Yes: NPRS scale: 0/10 Pain location: right knee  Pain description: aching  Aggravating factors: running/ luger  Relieving factors: rest  PRECAUTIONS: None  WEIGHT BEARING RESTRICTIONS No  FALLS:  Has patient fallen in last 6 months? No  LIVING ENVIRONMENT:  OCCUPATION:   PLOF: Independent Patient very active   PATIENT GOALS to have less pain    OBJECTIVE:   DIAGNOSTIC FINDINGS:  Right knee x-ray: No acute fracture or dislocation. No significant arthritic changes. Probable trace suprapatellar effusion. The soft tissues are unremarkable PATIENT SURVEYS:  FOTO    COGNITION:  Overall cognitive status: Within functional limits for tasks assessed     SENSATION: WFL  EDEMA: None noted    POSTURE: No Significant postural limitations  PALPATION: Trigger points along the IT band on the right   LOWER EXTREMITY ROM:  MMT Right eval Left eval  Hip flexion 28.6 27.1  Hip extension    Hip abduction 31.3 30.1  Hip  adduction    Hip internal rotation    Hip external rotation    Knee flexion    Knee extension 42.1 43.2  Ankle dorsiflexion    Ankle plantarflexion    Ankle inversion    Ankle eversion     (Blank rows = not tested)   Full active knee ROM and patella motion  LOWER EXTREMITY SPECIAL TESTS:    FUNCTIONAL TESTS:  Mild increase in lateral wear on the right shoe  Single leg stance decreased on the right  Squat: mild anterior movement of the knee over the toes   GAIT:  No noticeable deviations    TODAY'S TREATMENT:  Exercises - Supine Bridge with  Resistance Band  - 1 x daily - 4-5 x weekly - 3 sets - 10 reps - Single Leg Bridge  - 1 x daily - 4-5 x weekly - 3 sets - 10 reps - Reverse Lunge  - 1 x daily - 7 x weekly - 3 sets - 10 reps - Standard Lunge  - 1 x daily - 7 x weekly - 3 sets - 10 reps - Single Leg Stance with Diagonal Forward Reach  - 1 x daily - 7 x weekly - 3 sets - 10 reps - Side Stepping with Resistance at Thighs  - 1 x daily - 7 x weekly - 3 sets - 10 reps   PATIENT EDUCATION:  Education details: HEP, symptom management  Person educated: Patient Education method: Explanation, Demonstration, Tactile cues, Verbal cues, and Handouts Education comprehension: verbalized understanding, returned demonstration, verbal cues required, tactile cues required, and needs further education   HOME EXERCISE PROGRAM: Access Code: [email or web address] URL: [email or web address] Date: 01/17/2022 Prepared by: Carolyne Littles   ASSESSMENT:  CLINICAL IMPRESSION: Patient is a 51 y.o. female who was seen today for physical therapy evaluation and treatment for an acute onset of right knee pain. She has pain that increases after running. She is running about 8 miles a week at this time. She would benefit from skilled therapy to reduce pain while running. She has limited single leg stance time on the right vs left. Her strength is equal. She is having trouble at the gym doing lunges as well. She was given a strength and stability program geared towards running and lunging.    OBJECTIVE IMPAIRMENTS decreased activity tolerance, decreased knowledge of condition, decreased mobility, decreased strength, and pain.   ACTIVITY LIMITATIONS bending, standing, locomotion level, and running   PARTICIPATION LIMITATIONS: community activity and going to the gym   PERSONAL FACTORS 1 comorbidity: migranes  are also affecting patient's functional outcome.   REHAB POTENTIAL: Excellent  CLINICAL DECISION MAKING: Stable/uncomplicated  EVALUATION  COMPLEXITY: Low   GOALS: Goals reviewed with patient? Yes  SHORT TERM GOALS: Target date: 02/07/2022  Patient will report a 75% reduction in TTP in the IT band  Baseline: Goal status: INITIAL  2.  Patient will increase right single leg stance to 30 sec without instability  Baseline:  Goal status: INITIAL  3.  Patient will be independent with basic HEP  Baseline:  Goal status:  INITIAL  LONG TERM GOALS: Target date: 02/28/2022   Patient will return to running without pain  Baseline:  Goal status: INITIAL  2.  Patient will return to full gym program with exercises that promote knee strength and stability.  Baseline:  Goal status: INITIAL    PLAN: PT FREQUENCY: 1x/week  PT DURATION: 6 weeks  PLANNED INTERVENTIONS: Therapeutic exercises, Therapeutic activity, Neuromuscular re-education, Gait training, Patient/Family  education, Self Care, Joint mobilization, Stair training, Aquatic Therapy, Dry Needling, Cryotherapy, Moist heat, Taping, and Manual therapy  PLAN FOR NEXT SESSION: continue with balance exercises and single leg exercises; review TRX exercises; review cable exercises   PHYSICAL THERAPY DISCHARGE SUMMARY  Visits from Start of Care: 1  Current functional level related to goals / functional outcomes: Unknown    Remaining deficits: Unknown    Education / Equipment: HEP   Patient agrees to discharge. Patient goals were not met. Patient is being discharged due to not returning since the last visit.   Carney Living, PT 01/17/2022, 9:11 PM

## 2022-01-17 ENCOUNTER — Encounter (HOSPITAL_BASED_OUTPATIENT_CLINIC_OR_DEPARTMENT_OTHER): Payer: Self-pay | Admitting: Physical Therapy

## 2022-01-17 ENCOUNTER — Other Ambulatory Visit: Payer: Self-pay

## 2022-01-17 ENCOUNTER — Ambulatory Visit (HOSPITAL_BASED_OUTPATIENT_CLINIC_OR_DEPARTMENT_OTHER): Payer: BC Managed Care – PPO | Attending: Orthopaedic Surgery | Admitting: Physical Therapy

## 2022-01-17 DIAGNOSIS — M25561 Pain in right knee: Secondary | ICD-10-CM | POA: Insufficient documentation

## 2022-01-17 DIAGNOSIS — M222X1 Patellofemoral disorders, right knee: Secondary | ICD-10-CM | POA: Insufficient documentation

## 2022-01-30 ENCOUNTER — Ambulatory Visit (HOSPITAL_BASED_OUTPATIENT_CLINIC_OR_DEPARTMENT_OTHER): Payer: BC Managed Care – PPO | Admitting: Physical Therapy

## 2022-02-13 ENCOUNTER — Encounter (HOSPITAL_BASED_OUTPATIENT_CLINIC_OR_DEPARTMENT_OTHER): Payer: BC Managed Care – PPO | Admitting: Physical Therapy

## 2023-06-07 ENCOUNTER — Telehealth: Payer: BC Managed Care – PPO | Admitting: Family Medicine

## 2023-06-07 DIAGNOSIS — J069 Acute upper respiratory infection, unspecified: Secondary | ICD-10-CM

## 2023-06-07 MED ORDER — BENZONATATE 200 MG PO CAPS: 200.0000 mg | ORAL_CAPSULE | Freq: Two times a day (BID) | ORAL | 0 refills | Status: AC | PRN

## 2023-06-07 MED ORDER — PREDNISONE 10 MG (21) PO TBPK: ORAL_TABLET | ORAL | 0 refills | Status: AC

## 2023-06-07 NOTE — Progress Notes (Signed)
 E-Visit for Cough   We are sorry that you are not feeling well.  Here is how we plan to help!  Based on your presentation I believe you most likely have A cough due to a virus.  This is called viral bronchitis and is best treated by rest, plenty of fluids and control of the cough.  You may use Ibuprofen or Tylenol as directed to help your symptoms.     In addition you may use A prescription cough medication called Tessalon Perles 100mg . You may take 1-2 capsules every 8 hours as needed for your cough.  Prednisone 10 mg daily for 6 days (see taper instructions below)  From your responses in the eVisit questionnaire you describe inflammation in the upper respiratory tract which is causing a significant cough.  This is commonly called Bronchitis and has four common causes:   Allergies Viral Infections Acid Reflux Bacterial Infection Allergies, viruses and acid reflux are treated by controlling symptoms or eliminating the cause. An example might be a cough caused by taking certain blood pressure medications. You stop the cough by changing the medication. Another example might be a cough caused by acid reflux. Controlling the reflux helps control the cough.  USE OF BRONCHODILATOR ("RESCUE") INHALERS: There is a risk from using your bronchodilator too frequently.  The risk is that over-reliance on a medication which only relaxes the muscles surrounding the breathing tubes can reduce the effectiveness of medications prescribed to reduce swelling and congestion of the tubes themselves.  Although you feel brief relief from the bronchodilator inhaler, your asthma may actually be worsening with the tubes becoming more swollen and filled with mucus.  This can delay other crucial treatments, such as oral steroid medications. If you need to use a bronchodilator inhaler daily, several times per day, you should discuss this with your provider.  There are probably better treatments that could be used to keep your  asthma under control.     HOME CARE Only take medications as instructed by your medical team. Complete the entire course of an antibiotic. Drink plenty of fluids and get plenty of rest. Avoid close contacts especially the very young and the elderly Cover your mouth if you cough or cough into your sleeve. Always remember to wash your hands A steam or ultrasonic humidifier can help congestion.   GET HELP RIGHT AWAY IF: You develop worsening fever. You become short of breath You cough up blood. Your symptoms persist after you have completed your treatment plan MAKE SURE YOU  Understand these instructions. Will watch your condition. Will get help right away if you are not doing well or get worse.    Thank you for choosing an e-visit.  Your e-visit answers were reviewed by a board certified advanced clinical practitioner to complete your personal care plan. Depending upon the condition, your plan could have included both over the counter or prescription medications.  Please review your pharmacy choice. Make sure the pharmacy is open so you can pick up prescription now. If there is a problem, you may contact your provider through [employer] and have the prescription routed to another pharmacy.  Your safety is important to [place]. If you have drug allergies check your prescription carefully.   For the next 24 hours you can use MyChart to ask questions about today's visit, request a non-urgent call back, or ask for a work or school excuse. You will get an email in the next two days asking about your experience. I hope  that your e-visit has been valuable and will speed your recovery.    have provided 5 minutes of non face to face time during this encounter for chart review and documentation.
